# Patient Record
Sex: Female | Born: 1995 | Race: Black or African American | Hispanic: No | Marital: Single | Smoking: Never smoker
Health system: Southern US, Community
[De-identification: ages and names within clinical notes are randomized; demographics above are authoritative.]

## PROBLEM LIST (undated history)

## (undated) DIAGNOSIS — Z789 Other specified health status: Secondary | ICD-10-CM

## (undated) DIAGNOSIS — L309 Dermatitis, unspecified: Secondary | ICD-10-CM

## (undated) HISTORY — PX: WISDOM TOOTH EXTRACTION: SHX21

---

## 2016-08-07 LAB — HM PAP SMEAR: HM PAP: NEGATIVE

## 2017-08-12 ENCOUNTER — Encounter (HOSPITAL_COMMUNITY): Payer: Self-pay

## 2017-08-12 ENCOUNTER — Emergency Department (HOSPITAL_COMMUNITY)
Admission: EM | Admit: 2017-08-12 | Discharge: 2017-08-12 | Disposition: A | Discharge status: Home / Self Care | Payer: PRIVATE HEALTH INSURANCE | Attending: Emergency Medicine | Admitting: Emergency Medicine

## 2017-08-12 DIAGNOSIS — Y929 Unspecified place or not applicable: Secondary | ICD-10-CM | POA: Diagnosis not present

## 2017-08-12 DIAGNOSIS — S6991XA Unspecified injury of right wrist, hand and finger(s), initial encounter: Secondary | ICD-10-CM

## 2017-08-12 DIAGNOSIS — Y998 Other external cause status: Secondary | ICD-10-CM | POA: Insufficient documentation

## 2017-08-12 DIAGNOSIS — Y9389 Activity, other specified: Secondary | ICD-10-CM | POA: Insufficient documentation

## 2017-08-12 DIAGNOSIS — L309 Dermatitis, unspecified: Secondary | ICD-10-CM | POA: Insufficient documentation

## 2017-08-12 DIAGNOSIS — Y33XXXA Other specified events, undetermined intent, initial encounter: Secondary | ICD-10-CM | POA: Diagnosis not present

## 2017-08-12 MED ORDER — HYDROCODONE-ACETAMINOPHEN 5-325 MG PO TABS
1.0000 | ORAL_TABLET | Freq: Once | ORAL | Status: AC
  Administered 2017-08-12: 1 via ORAL
  Filled 2017-08-12: qty 1

## 2017-08-12 NOTE — Discharge Instructions (Signed)
Please read instructions below. Apply ice to your finger for 20 minutes at a time. You can take 600mg  of advil every 6 hours as needed for pain. Schedule an appointment with the hand specialist in week as needed if symptoms persist. Return to the ER for new or concerning symptoms.

## 2017-08-12 NOTE — ED Provider Notes (Signed)
[facility]-EMERGENCY DEPT Provider Note   CSN: [id number] Arrival date & time: 08/12/17  1732     History   Chief Complaint Chief Complaint  Patient presents with  . Finger Injury    HPI Andee LinemanMakeda Hildebrant is a 21 y.o. female without significant past medical history, presenting to the ED with acute onset of right fifth finger pain and edema that began 3 days ago. Patient states she was "pop in her knuckles" type pushing down on the middle interphalangeal joint, when she immediately had pain. She states since that time, her finger has been gradually becoming swollen, red, and painful. She reported to urgent care today, where they did an x-ray that was reportedly negative. She was recommended to come to the ED to rule out infection. States she is taking Advil with some relief of symptoms. Denies fever, chills, nausea, vomiting, history of immunocompromise, history of gout, or any other complaints today.  The history is provided by the patient.    History reviewed. No pertinent past medical history.  There are no active problems to display for this patient.   History reviewed. No pertinent surgical history.  OB History    No data available       Home Medications    Prior to Admission medications   Not on File    Family History No family history on file.  Social History Social History  Substance Use Topics  . Smoking status: Not on file  . Smokeless tobacco: Not on file  . Alcohol use Not on file     Allergies   Patient has no known allergies.   Review of Systems Review of Systems  Constitutional: Negative for chills and fever.  Gastrointestinal: Negative for nausea and vomiting.  Musculoskeletal: Positive for arthralgias and joint swelling.  Skin: Positive for color change. Negative for wound.  Neurological: Negative for numbness.     Physical Exam Updated Vital Signs BP 135/80 (BP Location: Left Arm)   Pulse 81   Temp 98.4 F (36.9 C) (Oral)   Resp 16   Wt  86.6 kg (191 lb)   LMP 07/29/2017   SpO2 99%   Physical Exam  Constitutional: She appears well-developed and well-nourished. No distress.  HENT:  Head: Normocephalic and atraumatic.  Eyes: Conjunctivae are normal.  Cardiovascular: Normal rate and intact distal pulses.   Intact radial and ulnar pulses.  Pulmonary/Chest: Effort normal.  Musculoskeletal:  Right fifth finger with edema, and erythema localized to the middle interphalangeal joint. Joint is TTP, with preserved active range of motion including extension and flexion of digit. Edema does not extend past the digit into the palm.  Neurological:  Digit and hand with normal sensation.  Psychiatric: She has a normal mood and affect. Her behavior is normal.  Nursing note and vitals reviewed.    ED Treatments / Results  Labs (all labs ordered are listed, but only abnormal results are displayed) Labs Reviewed - No data to display  EKG  EKG Interpretation None       Radiology No results found.  Procedures Procedures (including critical care time)  Medications Ordered in ED Medications  HYDROcodone-acetaminophen (NORCO/VICODIN) 5-325 MG per tablet 1 tablet (not administered)     Initial Impression / Assessment and Plan / ED Course  I have reviewed the triage vital signs and the nursing notes.  Pertinent labs & imaging results that were available during my care of the patient were reviewed by me and considered in my medical decision making (see chart  for details).     Patient presenting with right fifth finger pain and edema after attempting to "pop her knuckle." on exam, patient appears to have local inflammatory response to injury, with preserved active range of motion. NV intact. Patient without risk factors for infectious etiology. No wounds. No history of gout. Finger splint applied. Advised patient to follow up with PCP and monitor for signs of expanding erythema. Hand specialist referral given as needed if  symptoms persist. Conservative therapy recommended and discussed. She is afebrile, non-distress, safe for discharge home.  Patient discussed with Dr. Rosalia Hammers.  Discussed results, findings, treatment and follow up. Patient advised of return precautions. Patient verbalized understanding and agreed with plan.  Final Clinical Impressions(s) / ED Diagnoses   Final diagnoses:  Finger injury, right, initial encounter    New Prescriptions New Prescriptions   No medications on file     Russo, [place] N, PA-C 08/12/17 1836    Margarita Grizzle, MD 08/12/17 2115

## 2017-08-12 NOTE — ED Notes (Signed)
Patient Alert and oriented X4. Stable and ambulatory. Patient verbalized understanding of the discharge instructions.  Patient belongings were taken by the patient. Patient is not driving home

## 2017-08-12 NOTE — ED Triage Notes (Addendum)
Pt presents to the ed with complaints of swelling in her right pinky finger after trying to pop her knuckles two days ago. Sensation intact. Pt went to urgent care and had xrays and was sent here incase her pinky was infected.

## 2017-08-12 NOTE — ED Notes (Signed)
ED Provider at bedside. 

## 2018-05-27 ENCOUNTER — Ambulatory Visit (INDEPENDENT_AMBULATORY_CARE_PROVIDER_SITE_OTHER): Payer: PRIVATE HEALTH INSURANCE | Admitting: Adult Health

## 2018-05-27 ENCOUNTER — Encounter: Payer: Self-pay | Admitting: Adult Health

## 2018-05-27 ENCOUNTER — Other Ambulatory Visit: Payer: Self-pay | Admitting: Adult Health

## 2018-05-27 ENCOUNTER — Ambulatory Visit (HOSPITAL_BASED_OUTPATIENT_CLINIC_OR_DEPARTMENT_OTHER)
Admission: RE | Admit: 2018-05-27 | Discharge: 2018-05-27 | Disposition: A | Discharge status: Home / Self Care | Payer: PRIVATE HEALTH INSURANCE | Source: Ambulatory Visit | Attending: Adult Health | Admitting: Adult Health

## 2018-05-27 VITALS — BP 135/82 | HR 63 | Ht 65.0 in | Wt 204.0 lb

## 2018-05-27 DIAGNOSIS — Z0000 Encounter for general adult medical examination without abnormal findings: Secondary | ICD-10-CM | POA: Diagnosis not present

## 2018-05-27 DIAGNOSIS — R1909 Other intra-abdominal and pelvic swelling, mass and lump: Secondary | ICD-10-CM | POA: Diagnosis not present

## 2018-05-27 DIAGNOSIS — IMO0001 Reserved for inherently not codable concepts without codable children: Secondary | ICD-10-CM

## 2018-05-27 DIAGNOSIS — K429 Umbilical hernia without obstruction or gangrene: Secondary | ICD-10-CM | POA: Insufficient documentation

## 2018-05-27 DIAGNOSIS — Z789 Other specified health status: Secondary | ICD-10-CM

## 2018-05-27 MED ORDER — NORGESTIM-ETH ESTRAD TRIPHASIC 0.18/0.215/0.25 MG-35 MCG PO TABS: ORAL_TABLET | ORAL | 11 refills | Status: DC

## 2018-05-27 NOTE — Assessment & Plan Note (Deleted)
Pt has been NPO at day, instructed not to drink/eat until US completed/read Pt denies pain or change in bowel/bladder habits Urgent Abd US ordered

## 2018-05-27 NOTE — Assessment & Plan Note (Signed)
Urgent Abdominal Ultrasound ordered. We will call you when results are available and referral is appropriate. Recommend compete physical with fasting labs in 4 months.

## 2018-05-27 NOTE — Patient Instructions (Addendum)
Umbilical Hernia, Adult A hernia is a bulge of tissue that pushes through an opening between muscles. An umbilical hernia happens in the abdomen, near the belly button (umbilicus). The hernia may contain tissues from the small intestine, large intestine, or fatty tissue covering the intestines (omentum). Umbilical hernias in adults tend to get worse over time, and they require surgical treatment. There are several types of umbilical hernias. You may have:  A hernia located just above or below the umbilicus (indirect hernia). This is the most common type of umbilical hernia in adults.  A hernia that forms through an opening formed by the umbilicus (direct hernia).  A hernia that comes and goes (reducible hernia). A reducible hernia may be visible only when you strain, lift something heavy, or cough. This type of hernia can be pushed back into the abdomen (reduced).  A hernia that traps abdominal tissue inside the hernia (incarcerated hernia). This type of hernia cannot be reduced.  A hernia that cuts off blood flow to the tissues inside the hernia (strangulated hernia). The tissues can start to die if this happens. This type of hernia requires emergency treatment.  What are the causes? An umbilical hernia happens when tissue inside the abdomen presses on a weak area of the abdominal muscles. What increases the risk? You may have a greater risk of this condition if you:  Are obese.  Have had several pregnancies.  Have a buildup of fluid inside your abdomen (ascites).  Have had surgery that weakens the abdominal muscles.  What are the signs or symptoms? The main symptom of this condition is a painless bulge at or near the belly button. A reducible hernia may be visible only when you strain, lift something heavy, or cough. Other symptoms may include:  Dull pain.  A feeling of pressure.  Symptoms of a strangulated hernia may include:  Pain that gets increasingly worse.  Nausea and  vomiting.  Pain when pressing on the hernia.  Skin over the hernia becoming red or purple.  Constipation.  Blood in the stool.  How is this diagnosed? This condition may be diagnosed based on:  A physical exam. You may be asked to cough or strain while standing. These actions increase the pressure inside your abdomen and force the hernia through the opening in your muscles. Your health care provider may try to reduce the hernia by pressing on it.  Your symptoms and medical history.  How is this treated? Surgery is the only treatment for an umbilical hernia. Surgery for a strangulated hernia is done as soon as possible. If you have a small hernia that is not incarcerated, you may need to lose weight before having surgery. Follow these instructions at home:  Lose weight, if told by your health care provider.  Do not try to push the hernia back in.  Watch your hernia for any changes in color or size. Tell your health care provider if any changes occur.  You may need to avoid activities that increase pressure on your hernia.  Do not lift anything that is heavier than 10 lb (4.5 kg) until your health care provider says that this is safe.  Take over-the-counter and prescription medicines only as told by your health care provider.  Keep all follow-up visits as told by your health care provider. This is important. Contact a health care provider if:  Your hernia gets larger.  Your hernia becomes painful. Get help right away if:  You develop sudden, severe pain near the   area of your hernia.  You have pain as well as nausea or vomiting.  You have pain and the skin over your hernia changes color.  You develop a fever. This information is not intended to replace advice given to you by your health care provider. Make sure you discuss any questions you have with your health care provider. Document Released: 04/27/2016 Document Revised: 07/29/2016 Document Reviewed:  04/27/2016 Elsevier Interactive Patient Education  2018 [employer]   Mediterranean Diet A Mediterranean diet refers to food and lifestyle choices that are based on the traditions of countries located on the [place]. This way of eating has been shown to help prevent certain conditions and improve outcomes for people who have chronic diseases, like kidney disease and heart disease. What are tips for following this plan? Lifestyle  Cook and eat meals together with your family, when possible.  Drink enough fluid to keep your urine clear or pale yellow.  Be physically active every day. This includes: ? Aerobic exercise like running or swimming. ? Leisure activities like gardening, walking, or housework.  Get 7-8 hours of sleep each night.  If recommended by your health care provider, drink red wine in moderation. This means 1 glass a day for nonpregnant women and 2 glasses a day for men. A glass of wine equals 5 oz (150 mL). Reading food labels  Check the serving size of packaged foods. For foods such as rice and pasta, the serving size refers to the amount of cooked product, not dry.  Check the total fat in packaged foods. Avoid foods that have saturated fat or trans fats.  Check the ingredients list for added sugars, such as corn syrup. Shopping  At the grocery store, buy most of your food from the areas near the walls of the store. This includes: ? Fresh fruits and vegetables (produce). ? Grains, beans, nuts, and seeds. Some of these may be available in unpackaged forms or large amounts (in bulk). ? Fresh seafood. ? Poultry and eggs. ? Low-fat dairy products.  Buy whole ingredients instead of prepackaged foods.  Buy fresh fruits and vegetables in-season from local farmers markets.  Buy frozen fruits and vegetables in resealable bags.  If you do not have access to quality fresh seafood, buy precooked frozen shrimp or canned fish, such as tuna, salmon, or  sardines.  Buy small amounts of raw or cooked vegetables, salads, or olives from the deli or salad bar at your store.  Stock your pantry so you always have certain foods on hand, such as olive oil, canned tuna, canned tomatoes, rice, pasta, and beans. Cooking  Cook foods with extra-virgin olive oil instead of using butter or other vegetable oils.  Have meat as a side dish, and have vegetables or grains as your main dish. This means having meat in small portions or adding small amounts of meat to foods like pasta or stew.  Use beans or vegetables instead of meat in common dishes like chili or lasagna.  Experiment with different cooking methods. Try roasting or broiling vegetables instead of steaming or sauteing them.  Add frozen vegetables to soups, stews, pasta, or rice.  Add nuts or seeds for added healthy fat at each meal. You can add these to yogurt, salads, or vegetable dishes.  Marinate fish or vegetables using olive oil, lemon juice, garlic, and fresh herbs. Meal planning  Plan to eat 1 vegetarian meal one day each week. Try to work up to 2 vegetarian meals, if possible.  Eat seafood 2 or more times a week.  Have healthy snacks readily available, such as: ? Vegetable sticks with hummus. ? [place] yogurt. ? Fruit and nut trail mix.  Eat balanced meals throughout the week. This includes: ? Fruit: 2-3 servings a day ? Vegetables: 4-5 servings a day ? Low-fat dairy: 2 servings a day ? Fish, poultry, or lean meat: 1 serving a day ? Beans and legumes: 2 or more servings a week ? Nuts and seeds: 1-2 servings a day ? Whole grains: 6-8 servings a day ? Extra-virgin olive oil: 3-4 servings a day  Limit red meat and sweets to only a few servings a month What are my food choices?  Mediterranean diet ? Recommended ? Grains: Whole-grain pasta. Brown rice. Bulgar wheat. Polenta. Couscous. Whole-wheat bread. Orpah Cobb. ? Vegetables: Artichokes. Beets. Broccoli. Cabbage.  Carrots. Eggplant. Green beans. Chard. Kale. Spinach. Onions. Leeks. Peas. Squash. Tomatoes. Peppers. Radishes. ? Fruits: Apples. Apricots. Avocado. Berries. Bananas. Cherries. Dates. Figs. Grapes. Lemons. Melon. Oranges. Peaches. Plums. Pomegranate. ? Meats and other protein foods: Beans. Almonds. Sunflower seeds. Pine nuts. Peanuts. Cod. Salmon. Scallops. Shrimp. Tuna. Tilapia. Clams. Oysters. Eggs. ? Dairy: Low-fat milk. Cheese. Greek yogurt. ? Beverages: Water. Red wine. Herbal tea. ? Fats and oils: Extra virgin olive oil. Avocado oil. Grape seed oil. ? Sweets and desserts: [place] yogurt with honey. Baked apples. Poached pears. Trail mix. ? Seasoning and other foods: Basil. Cilantro. Coriander. Cumin. Mint. Parsley. Sage. Rosemary. Tarragon. Garlic. Oregano. Thyme. Pepper. Balsalmic vinegar. Tahini. Hummus. Tomato sauce. Olives. Mushrooms. ? Limit these ? Grains: Prepackaged pasta or rice dishes. Prepackaged cereal with added sugar. ? Vegetables: Deep fried potatoes (french fries). ? Fruits: Fruit canned in syrup. ? Meats and other protein foods: Beef. Pork. Lamb. Poultry with skin. Hot dogs. Tomasa Blase. ? Dairy: Ice cream. Sour cream. Whole milk. ? Beverages: Juice. Sugar-sweetened soft drinks. Beer. Liquor and spirits. ? Fats and oils: Butter. Canola oil. Vegetable oil. Beef fat (tallow). Lard. ? Sweets and desserts: Cookies. Cakes. Pies. Candy. ? Seasoning and other foods: Mayonnaise. Premade sauces and marinades. ? The items listed may not be a complete list. Talk with your dietitian about what dietary choices are right for you. Summary  The Mediterranean diet includes both food and lifestyle choices.  Eat a variety of fresh fruits and vegetables, beans, nuts, seeds, and whole grains.  Limit the amount of red meat and sweets that you eat.  Talk with your health care provider about whether it is safe for you to drink red wine in moderation. This means 1 glass a day for nonpregnant women  and 2 glasses a day for men. A glass of wine equals 5 oz (150 mL). This information is not intended to replace advice given to you by your health care provider. Make sure you discuss any questions you have with your health care provider. Document Released: 07/19/2016 Document Revised: 08/21/2016 Document Reviewed: 07/19/2016 Elsevier Interactive Patient Education  2018 [employer].   Urgent Abdominal Ultrasound ordered. We will call you when results are available and referral is appropriate. Recommend compete physical with fasting labs in 4 months. WELCOME TO THE PRACTICE!

## 2018-05-27 NOTE — Progress Notes (Addendum)
Subjective:    Patient ID: Faith Bush, female    DOB: [birth year], 22 y.o.   MRN: [id number]  HPI:  Faith Bush is here to establish as a new pt.  She is a pleasant 22 year old female.   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 She denies lifting heavy objects or acute trauma prior to increase in sx's. Sx's include- intermittent pain with palpation and "a spot that turned black" a few weeks ago. She denies fever/poor appetite/change in bowel or bladder habits She denies tobacco/ETOH use She denies formal exercise She works PT at [employer] as Conservation officer, nature and will start graduate school at [employer] next month  Patient Care Team    Relationship Specialty Notifications Start End  Faith Bush, Jinny Blossom, NP PCP - General Family Medicine  05/06/18     Patient Active Problem List   Diagnosis Date Noted  . Healthcare maintenance 05/27/2018  . Recurrent umbilical hernia 05/27/2018  . Eczema 08/12/2017     No past medical history on file.   No past surgical history on file.   No family history on file.   Social History   Substance and Sexual Activity  Drug Use Never     Social History   Substance and Sexual Activity  Alcohol Use Yes   Comment: 3 drinks/week     Social History   Tobacco Use  Smoking Status Never Smoker  Smokeless Tobacco Never Used     Outpatient Encounter Medications as of 05/27/2018  Medication Sig  . Norgestimate-Ethinyl Estradiol Triphasic (ORTHO TRI-CYCLEN, 28,) 0.18/0.215/0.25 MG-35 MCG tablet Ortho Tri-Cyclen (28) 0.18 mg(7)/0.215 mg(7)/0.25 mg(7)-35 mcg tablet  Take 1 tablet every day by oral route.  . [DISCONTINUED] Norgestimate-Ethinyl Estradiol Triphasic (ORTHO TRI-CYCLEN, 28,) 0.18/0.215/0.25 MG-35 MCG tablet Ortho Tri-Cyclen (28) 0.18 mg(7)/0.215 mg(7)/0.25 mg(7)-35 mcg tablet  Take 1 tablet every day by oral route.   No facility-administered encounter medications on file as of  05/27/2018.     Allergies: Patient has no known allergies.  Body mass index is 33.95 kg/m.  Blood pressure 135/82, pulse 63, height 5\' 5"  (1.651 m), weight 204 lb (92.5 kg), last menstrual period 05/15/2018, SpO2 100 %.  Review of Systems  Constitutional: Negative for activity change, appetite change, chills, diaphoresis, fatigue, fever and unexpected weight change.  Eyes: Negative for visual disturbance.  Respiratory: Negative for cough, chest tightness, shortness of breath, wheezing and stridor.   Cardiovascular: Negative for chest pain, palpitations and leg swelling.  Gastrointestinal: Positive for abdominal pain and constipation. Negative for abdominal distention, blood in stool, diarrhea, nausea and vomiting.  Endocrine: Negative for cold intolerance, heat intolerance, polydipsia, polyphagia and polyuria.  Genitourinary: Negative for difficulty urinating, flank pain and hematuria.  Musculoskeletal: Negative for arthralgias, back pain, gait problem, joint swelling, myalgias, neck pain and neck stiffness.  Skin: Negative for color change, pallor, rash and wound.  Neurological: Negative for dizziness and headaches.  Hematological: Does not bruise/bleed easily.  Psychiatric/Behavioral: Negative for hallucinations, self-injury, sleep disturbance and suicidal ideas. The patient is not nervous/anxious and is not hyperactive.        Objective:   Physical Exam  Constitutional: She appears well-developed and well-nourished. No distress.  HENT:  Head: Normocephalic and atraumatic.  Right Ear: External ear normal.  Left Ear: External ear normal.  Cardiovascular: Normal rate, regular rhythm, normal heart sounds and intact distal pulses.  No murmur heard. Pulmonary/Chest: Effort normal and breath sounds normal. No stridor.  No respiratory distress. She has no wheezes. She has no rales. She exhibits no tenderness.  Abdominal: Soft. Bowel sounds are normal. She exhibits no distension and no  mass. There is tenderness in the periumbilical area. There is no rebound, no guarding, no tenderness at McBurney's point and negative Murphy's sign. No hernia.  Blackened tissue at 3 o clock position around umbilical  Palpable mass at top R of umbilical region No hernia appreciated  Skin: She is not diaphoretic.      Assessment & Plan:   1. Healthcare maintenance   2. Umbilical mass   3. Birth control     Healthcare maintenance Urgent Abdominal Ultrasound ordered. We will call you when results are available and referral is appropriate. Recommend compete physical with fasting labs in 4 months.  Recurrent umbilical hernia US 05/28/18 Results- IMPRESSION: No hernia is identified. Hypoechoic focus in the subcutaneous tissues as described above is nonspecific. It could represent a sebaceous cyst, fat necrosis or less likely hematoma. It does not appear aggressive.  Referral to general surgery placed to evaluate/treat   FOLLOW-UP:  Return in about 4 months (around 09/26/2018) for CPE, Fasting Labs.

## 2018-05-28 ENCOUNTER — Telehealth: Payer: Self-pay | Admitting: Adult Health

## 2018-05-28 ENCOUNTER — Other Ambulatory Visit: Payer: Self-pay | Admitting: Adult Health

## 2018-05-28 DIAGNOSIS — L723 Sebaceous cyst: Secondary | ICD-10-CM

## 2018-05-28 NOTE — Assessment & Plan Note (Signed)
US 05/28/18 Results- IMPRESSION: No hernia is identified. Hypoechoic focus in the subcutaneous tissues as described above is nonspecific. It could represent a sebaceous cyst, fat necrosis or less likely hematoma. It does not appear aggressive.  Referral to general surgery placed to evaluate/treat

## 2018-05-28 NOTE — Telephone Encounter (Signed)
Patient states Faith Bush called her to give results of Ultrasound---   ---patient reachable at [phone].  --forwarding message.  --glh

## 2018-06-09 ENCOUNTER — Ambulatory Visit: Payer: Self-pay | Admitting: General Surgery

## 2018-06-09 NOTE — H&P (Signed)
History of Present Illness Axel Filler(Cedric Denison MD; 06/09/2018 2:58 PM) The patient is a 22 year old female who presents with a complaint of cyst. Patient is a 22 year old female who is referred by Ayesha MohairKatie Danford, [facility] for chief complaint of a umbilical sebaceous cyst. Patient states that she's had these there for several months. He states that over the last couple months she's noticed that his changed colors and become more bothersome. She states sore nature. She states that she's had no drainage. She states it is not changed with her monthly cycles. Patient underwent ultrasound which did reveal subcutaneous sebaceous cyst versus fat necrosis versus less likely hematoma. There is no hernia seen. Incomplete this personally.     Past Surgical History (Tanisha A. Manson PasseyBrown, RMA; 06/09/2018 2:49 PM) Oral Surgery   Diagnostic Studies History (Tanisha A. Manson PasseyBrown, RMA; 06/09/2018 2:49 PM) Colonoscopy  never Mammogram  never Pap Smear  never  Allergies (Tanisha A. Manson PasseyBrown, RMA; 06/09/2018 2:50 PM) No Known Drug Allergies [06/09/2018]: Allergies Reconciled   Medication History (Tanisha A. Manson PasseyBrown, RMA; 06/09/2018 2:50 PM) Tri-Sprintec (0.18/0.215/0.25MG -35 MCG Tablet, Oral) Active. Medications Reconciled  Social History (Tanisha A. Manson PasseyBrown, RMA; 06/09/2018 2:49 PM) Alcohol use  Occasional alcohol use. No caffeine use  No drug use  Tobacco use  Never smoker.  Family History (Tanisha A. Manson PasseyBrown, RMA; 06/09/2018 2:49 PM) Arthritis  Family Members In General. Diabetes Mellitus  Family Members In General. Heart disease in female family member before age 555  Hypertension  Family Members In General.  Pregnancy / Birth History (Tanisha A. Manson PasseyBrown, RMA; 06/09/2018 2:49 PM) Age at menarche  12 years. Contraceptive History  Oral contraceptives. Gravida  0 Para  0 Regular periods   Other Problems (Tanisha A. Manson PasseyBrown, RMA; 06/09/2018 2:49 PM) Umbilical Hernia Repair     Review of Systems Axel Filler(Latron Ribas MD; 06/09/2018 2:57 PM) General Not Present- Appetite Loss, Chills, Fatigue, Fever, Night Sweats, Weight Gain and Weight Loss. Skin Present- Dryness. Not Present- Change in Wart/Mole, Hives, Jaundice, New Lesions, Non-Healing Wounds, Rash and Ulcer. HEENT Present- Seasonal Allergies. Not Present- Earache, Hearing Loss, Hoarseness, Nose Bleed, Oral Ulcers, Ringing in the Ears, Sinus Pain, Sore Throat, Visual Disturbances, Wears glasses/contact lenses and Yellow Eyes. Respiratory Not Present- Bloody sputum, Chronic Cough, Difficulty Breathing, Snoring and Wheezing. Breast Not Present- Breast Mass, Breast Pain, Nipple Discharge and Skin Changes. Cardiovascular Not Present- Chest Pain, Difficulty Breathing Lying Down, Leg Cramps, Palpitations, Rapid Heart Rate, Shortness of Breath and Swelling of Extremities. Gastrointestinal Present- Change in Bowel Habits. Not Present- Abdominal Pain, Bloating, Bloody Stool, Chronic diarrhea, Constipation, Difficulty Swallowing, Excessive gas, Gets full quickly at meals, Hemorrhoids, Indigestion, Nausea, Rectal Pain and Vomiting. Female Genitourinary Not Present- Frequency, Nocturia, Painful Urination, Pelvic Pain and Urgency. Musculoskeletal Not Present- Back Pain, Joint Pain, Joint Stiffness, Muscle Pain, Muscle Weakness and Swelling of Extremities. Neurological Not Present- Decreased Memory, Fainting, Headaches, Numbness, Seizures, Tingling, Tremor, Trouble walking and Weakness. Psychiatric Not Present- Anxiety, Bipolar, Change in Sleep Pattern, Depression, Fearful and Frequent crying. Endocrine Not Present- Cold Intolerance, Excessive Hunger, Hair Changes, Heat Intolerance, Hot flashes and New Diabetes. Hematology Not Present- Blood Thinners, Easy Bruising, Excessive bleeding, Gland problems, HIV and Persistent Infections. All other systems negative  Vitals (Tanisha A. Brown RMA; 06/09/2018 2:50 PM) 06/09/2018 2:49 PM Weight: 203.4 lb Height: 65in Body  Surface Area: 1.99 m Body Mass Index: 33.85 kg/m  Temp.: 98.72F  Pulse: 88 (Regular)  BP: 124/86 (Sitting, Left Arm, Standard)       Physical  Exam Axel Filler MD; 06/09/2018 2:58 PM) The physical exam findings are as follows: Note:Constitutional: No acute distress, conversant, appears stated age  Eyes: Anicteric sclerae, moist conjunctiva, no lid lag  Neck: No thyromegaly, trachea midline, no cervical lymphadenopathy  Lungs: Clear to auscultation biilaterally, normal respiratory effot  Cardiovascular: regular rate & rhythm, no murmurs, no peripheal edema, pedal pulses 2+  GI: Soft, no masses or hepatosplenomegaly, non-tender to palpation, left lateral cyst, approximately 2 x 1 cm,  MSK: Normal gait, no clubbing cyanosis, edema  Skin: No rashes, palpation reveals normal skin turgor  Psychiatric: Appropriate judgment and insight, oriented to person, place, and time    Assessment & Plan Axel Filler MD; 06/09/2018 2:59 PM) CYST OF SKIN (L72.9) Impression: 22 year old female with a left lateral umbilical cyst.  1. Will proceed to the OR For excision of cyst. 2. Discussed with her the risks and benefits of the procedure to include but not limited to: Infection, bleeding, demonstrating treasure, possible recurrence. Patient was understanding and wishes to proceed.

## 2018-07-18 NOTE — Pre-Procedure Instructions (Signed)
Faith Bush  07/18/2018      [facility] #[id number] [place], [place] - [address] AT [facility] OF [facility] & [address] [place] [address] Phone: [phone] Fax: [phone]    Your procedure is scheduled on 07/28/18.  Report to [facility] Admitting at 530 A.M.  Call this number if you have problems the morning of surgery:  [phone]   Remember:  Do not eat or drink after midnight.     Take these medicines the morning of surgery with A SIP OF WATER --bcp    Do not wear jewelry, make-up or nail polish.  Do not wear lotions, powders, or perfumes, or deodorant.  Do not shave 48 hours prior to surgery.  Men may shave face and neck.  Do not bring valuables to the hospital.  [facility] is not responsible for any belongings or valuables.  Contacts, dentures or bridgework may not be worn into surgery.  Leave your suitcase in the car.  After surgery it may be brought to your room.  For patients admitted to the hospital, discharge time will be determined by your treatment team.  Patients discharged the day of surgery will not be allowed to drive home.   Name and phone number of your driver:   ---Do not take any aspirin,anti-inflammatories,vitamins,or herbal supplements 5-7 days prior to surgery. Special instructions:  [facility] - Preparing for Surgery  Before surgery, you can play an important role.  Because skin is not sterile, your skin needs to be as free of germs as possible.  You can reduce the number of germs on you skin by washing with CHG (chlorahexidine gluconate) soap before surgery.  CHG is an antiseptic cleaner which kills germs and bonds with the skin to continue killing germs even after washing.  Oral Hygiene is also important in reducing the risk of infection.  Remember to brush your teeth with your regular toothpaste the morning of surgery.  Please DO NOT use if you have an allergy to CHG or antibacterial  soaps.  If your skin becomes reddened/irritated stop using the CHG and inform your nurse when you arrive at Short Stay.  Do not shave (including legs and underarms) for at least 48 hours prior to the first CHG shower.  You may shave your face.  Please follow these instructions carefully:   1.  Shower with CHG Soap the night before surgery and the morning of Surgery.  2.  If you choose to wash your hair, wash your hair first as usual with your normal shampoo.  3.  After you shampoo, rinse your hair and body thoroughly to remove the shampoo. 4.  Use CHG as you would any other liquid soap.  You can apply chg directly to the skin and wash gently with a      scrungie or washcloth.           5.  Apply the CHG Soap to your body ONLY FROM THE NECK DOWN.   Do not use on open wounds or open sores. Avoid contact with your eyes, ears, mouth and genitals (private parts).  Wash genitals (private parts) with your normal soap.  6.  Wash thoroughly, paying special attention to the area where your surgery will be performed.  7.  Thoroughly rinse your body with warm water from the neck down.  8.  DO NOT shower/wash with your normal soap after using and rinsing off the  CHG Soap.  9.  Pat yourself dry with a clean towel.            10.  Wear clean pajamas.            11.  Place clean sheets on your bed the night of your first shower and do not sleep with pets.  Day of Surgery  Do not apply any lotions/deoderants the morning of surgery.   Please wear clean clothes to the hospital/surgery center. Remember to brush your teeth with toothpaste.  [facility] - Preparing for Surgery  Before surgery, you can play an important role.  Because skin is not sterile, your skin needs to be as free of germs as possible.  You can reduce the number of germs on you skin by washing with CHG (chlorahexidine gluconate) soap before surgery.  CHG is an antiseptic cleaner which kills germs and bonds with the skin to continue killing  germs even after washing.  Oral Hygiene is also important in reducing the risk of infection.  Remember to brush your teeth with your regular toothpaste the morning of surgery.  Please DO NOT use if you have an allergy to CHG or antibacterial soaps.  If your skin becomes reddened/irritated stop using the CHG and inform your nurse when you arrive at Short Stay.  Do not shave (including legs and underarms) for at least 48 hours prior to the first CHG shower.  You may shave your face.  Please follow these instructions carefully:   1.  Shower with CHG Soap the night before surgery and the morning of Surgery.  2.  If you choose to wash your hair, wash your hair first as usual with your normal shampoo.  3.  After you shampoo, rinse your hair and body thoroughly to remove the shampoo. 4.  Use CHG as you would any other liquid soap.  You can apply chg directly to the skin and wash gently with a      scrungie or washcloth.           5.  Apply the CHG Soap to your body ONLY FROM THE NECK DOWN.   Do not use on open wounds or open sores. Avoid contact with your eyes, ears, mouth and genitals (private parts).  Wash genitals (private parts) with your normal soap.  6.  Wash thoroughly, paying special attention to the area where your surgery will be performed.  7.  Thoroughly rinse your body with warm water from the neck down.  8.  DO NOT shower/wash with your normal soap after using and rinsing off the CHG Soap.  9.  Pat yourself dry with a clean towel.            10.  Wear clean pajamas.            11.  Place clean sheets on your bed the night of your first shower and do not sleep with pets.  Day of Surgery  Do not apply any lotions/deoderants the morning of surgery.   Please wear clean clothes to the hospital/surgery center. Remember to brush your teeth with toothpaste.  *  Please read over the following fact sheets that you were given.

## 2018-07-21 ENCOUNTER — Encounter (HOSPITAL_COMMUNITY): Payer: Self-pay

## 2018-07-21 ENCOUNTER — Encounter (HOSPITAL_COMMUNITY)
Admission: RE | Admit: 2018-07-21 | Discharge: 2018-07-21 | Disposition: A | Discharge status: Home / Self Care | Payer: PRIVATE HEALTH INSURANCE | Source: Ambulatory Visit | Attending: General Surgery | Admitting: General Surgery

## 2018-07-21 DIAGNOSIS — Z01812 Encounter for preprocedural laboratory examination: Secondary | ICD-10-CM | POA: Insufficient documentation

## 2018-07-21 HISTORY — DX: Dermatitis, unspecified: L30.9

## 2018-07-21 HISTORY — DX: Other specified health status: Z78.9

## 2018-07-21 LAB — CBC
HEMATOCRIT: 37.1 % (ref 36.0–46.0)
HEMOGLOBIN: 11.9 g/dL — AB (ref 12.0–15.0)
MCH: 26.6 pg (ref 26.0–34.0)
MCHC: 32.1 g/dL (ref 30.0–36.0)
MCV: 83 fL (ref 78.0–100.0)
Platelets: 199 10*3/uL (ref 150–400)
RBC: 4.47 MIL/uL (ref 3.87–5.11)
RDW: 13.6 % (ref 11.5–15.5)
WBC: 5.9 10*3/uL (ref 4.0–10.5)

## 2018-07-21 MED ORDER — CHLORHEXIDINE GLUCONATE CLOTH 2 % EX PADS: 6.0000 | MEDICATED_PAD | Freq: Once | CUTANEOUS | Status: DC

## 2018-07-28 ENCOUNTER — Other Ambulatory Visit: Payer: Self-pay

## 2018-07-28 ENCOUNTER — Encounter (HOSPITAL_COMMUNITY): Payer: Self-pay | Admitting: *Deleted

## 2018-07-28 ENCOUNTER — Ambulatory Visit (HOSPITAL_COMMUNITY): Payer: No Typology Code available for payment source

## 2018-07-28 ENCOUNTER — Ambulatory Visit (HOSPITAL_COMMUNITY)
Admission: RE | Admit: 2018-07-28 | Discharge: 2018-07-28 | Disposition: A | Discharge status: Home / Self Care | Payer: No Typology Code available for payment source | Source: Ambulatory Visit | Attending: General Surgery | Admitting: General Surgery

## 2018-07-28 ENCOUNTER — Encounter (HOSPITAL_COMMUNITY)
Admission: RE | Disposition: A | Discharge status: Home / Self Care | Payer: Self-pay | Source: Ambulatory Visit | Attending: General Surgery

## 2018-07-28 DIAGNOSIS — R1905 Periumbilic swelling, mass or lump: Secondary | ICD-10-CM | POA: Insufficient documentation

## 2018-07-28 HISTORY — PX: EXCISION OF KELOID: SHX6267

## 2018-07-28 LAB — POCT PREGNANCY, URINE: Preg Test, Ur: NEGATIVE

## 2018-07-28 SURGERY — EXCISION, KELOID
Anesthesia: General | Site: Abdomen

## 2018-07-28 MED ORDER — LACTATED RINGERS IV SOLN
INTRAVENOUS | Status: DC | PRN
  Administered 2018-07-28 (×2): via INTRAVENOUS

## 2018-07-28 MED ORDER — ONDANSETRON HCL 4 MG/2ML IJ SOLN
INTRAMUSCULAR | Status: AC
  Filled 2018-07-28: qty 2

## 2018-07-28 MED ORDER — ROCURONIUM BROMIDE 50 MG/5ML IV SOSY
PREFILLED_SYRINGE | INTRAVENOUS | Status: DC | PRN
  Administered 2018-07-28: 50 mg via INTRAVENOUS

## 2018-07-28 MED ORDER — ACETAMINOPHEN 500 MG PO TABS
1000.0000 mg | ORAL_TABLET | ORAL | Status: AC
  Administered 2018-07-28: 1000 mg via ORAL
  Filled 2018-07-28: qty 2

## 2018-07-28 MED ORDER — TRAMADOL HCL 50 MG PO TABS: 50.0000 mg | ORAL_TABLET | Freq: Four times a day (QID) | ORAL | 0 refills | Status: DC | PRN

## 2018-07-28 MED ORDER — SUGAMMADEX SODIUM 200 MG/2ML IV SOLN
INTRAVENOUS | Status: DC | PRN
  Administered 2018-07-28: 200 mg via INTRAVENOUS

## 2018-07-28 MED ORDER — DEXAMETHASONE SODIUM PHOSPHATE 10 MG/ML IJ SOLN
INTRAMUSCULAR | Status: AC
  Filled 2018-07-28: qty 1

## 2018-07-28 MED ORDER — PROPOFOL 10 MG/ML IV BOLUS
INTRAVENOUS | Status: DC | PRN
  Administered 2018-07-28: 130 mg via INTRAVENOUS

## 2018-07-28 MED ORDER — 0.9 % SODIUM CHLORIDE (POUR BTL) OPTIME
TOPICAL | Status: DC | PRN
  Administered 2018-07-28: 1000 mL

## 2018-07-28 MED ORDER — DEXAMETHASONE SODIUM PHOSPHATE 10 MG/ML IJ SOLN
INTRAMUSCULAR | Status: DC | PRN
  Administered 2018-07-28: 10 mg via INTRAVENOUS

## 2018-07-28 MED ORDER — BUPIVACAINE-EPINEPHRINE (PF) 0.25% -1:200000 IJ SOLN
INTRAMUSCULAR | Status: AC
  Filled 2018-07-28: qty 30

## 2018-07-28 MED ORDER — MIDAZOLAM HCL 5 MG/5ML IJ SOLN
INTRAMUSCULAR | Status: DC | PRN
  Administered 2018-07-28: 2 mg via INTRAVENOUS

## 2018-07-28 MED ORDER — ROCURONIUM BROMIDE 50 MG/5ML IV SOSY
PREFILLED_SYRINGE | INTRAVENOUS | Status: AC
  Filled 2018-07-28: qty 5

## 2018-07-28 MED ORDER — GABAPENTIN 300 MG PO CAPS
300.0000 mg | ORAL_CAPSULE | ORAL | Status: AC
  Administered 2018-07-28: 300 mg via ORAL
  Filled 2018-07-28: qty 1

## 2018-07-28 MED ORDER — CELECOXIB 200 MG PO CAPS
200.0000 mg | ORAL_CAPSULE | ORAL | Status: AC
  Administered 2018-07-28: 200 mg via ORAL
  Filled 2018-07-28: qty 1

## 2018-07-28 MED ORDER — LIDOCAINE 2% (20 MG/ML) 5 ML SYRINGE
INTRAMUSCULAR | Status: AC
  Filled 2018-07-28: qty 5

## 2018-07-28 MED ORDER — BUPIVACAINE-EPINEPHRINE 0.25% -1:200000 IJ SOLN
INTRAMUSCULAR | Status: DC | PRN
  Administered 2018-07-28: 10 mL

## 2018-07-28 MED ORDER — PROPOFOL 10 MG/ML IV BOLUS
INTRAVENOUS | Status: AC
  Filled 2018-07-28: qty 20

## 2018-07-28 MED ORDER — STERILE WATER FOR IRRIGATION IR SOLN
Status: DC | PRN
  Administered 2018-07-28: 1000 mL

## 2018-07-28 MED ORDER — FENTANYL CITRATE (PF) 250 MCG/5ML IJ SOLN
INTRAMUSCULAR | Status: AC
  Filled 2018-07-28: qty 5

## 2018-07-28 MED ORDER — ONDANSETRON HCL 4 MG/2ML IJ SOLN
INTRAMUSCULAR | Status: DC | PRN
  Administered 2018-07-28: 4 mg via INTRAVENOUS

## 2018-07-28 MED ORDER — FENTANYL CITRATE (PF) 100 MCG/2ML IJ SOLN
INTRAMUSCULAR | Status: DC | PRN
  Administered 2018-07-28: 150 ug via INTRAVENOUS

## 2018-07-28 MED ORDER — CEFAZOLIN SODIUM-DEXTROSE 2-4 GM/100ML-% IV SOLN
2.0000 g | INTRAVENOUS | Status: AC
  Administered 2018-07-28: 2 g via INTRAVENOUS
  Filled 2018-07-28: qty 100

## 2018-07-28 MED ORDER — LIDOCAINE 2% (20 MG/ML) 5 ML SYRINGE
INTRAMUSCULAR | Status: DC | PRN
  Administered 2018-07-28: 40 mg via INTRAVENOUS

## 2018-07-28 MED ORDER — MIDAZOLAM HCL 2 MG/2ML IJ SOLN
INTRAMUSCULAR | Status: AC
  Filled 2018-07-28: qty 2

## 2018-07-28 SURGICAL SUPPLY — 31 items
CHLORAPREP W/TINT 26ML (MISCELLANEOUS) ×3 IMPLANT
COVER SURGICAL LIGHT HANDLE (MISCELLANEOUS) ×3 IMPLANT
DERMABOND ADVANCED (GAUZE/BANDAGES/DRESSINGS) ×2
DERMABOND ADVANCED .7 DNX12 (GAUZE/BANDAGES/DRESSINGS) ×1 IMPLANT
DRAPE LAPAROTOMY 100X72 PEDS (DRAPES) ×3 IMPLANT
ELECT REM PT RETURN 9FT ADLT (ELECTROSURGICAL) ×3
ELECTRODE REM PT RTRN 9FT ADLT (ELECTROSURGICAL) ×1 IMPLANT
GLOVE BIO SURGEON STRL SZ7.5 (GLOVE) ×3 IMPLANT
GLOVE BIOGEL PI IND STRL 8 (GLOVE) ×1 IMPLANT
GLOVE BIOGEL PI INDICATOR 8 (GLOVE) ×2
GOWN STRL REUS W/ TWL LRG LVL3 (GOWN DISPOSABLE) ×1 IMPLANT
GOWN STRL REUS W/ TWL XL LVL3 (GOWN DISPOSABLE) ×1 IMPLANT
GOWN STRL REUS W/TWL LRG LVL3 (GOWN DISPOSABLE) ×2
GOWN STRL REUS W/TWL XL LVL3 (GOWN DISPOSABLE) ×2
KIT BASIN OR (CUSTOM PROCEDURE TRAY) ×3 IMPLANT
KIT TURNOVER KIT B (KITS) ×3 IMPLANT
NEEDLE HYPO 25GX1X1/2 BEV (NEEDLE) ×3 IMPLANT
NS IRRIG 1000ML POUR BTL (IV SOLUTION) ×3 IMPLANT
PACK SURGICAL SETUP 50X90 (CUSTOM PROCEDURE TRAY) ×3 IMPLANT
PAD ARMBOARD 7.5X6 YLW CONV (MISCELLANEOUS) ×3 IMPLANT
SPECIMEN JAR SMALL (MISCELLANEOUS) ×3 IMPLANT
SPONGE LAP 18X18 X RAY DECT (DISPOSABLE) ×3 IMPLANT
SUT ETHIBOND 0 MO6 C/R (SUTURE) ×3 IMPLANT
SUT MNCRL AB 4-0 PS2 18 (SUTURE) ×3 IMPLANT
SUT VIC AB 3-0 SH 18 (SUTURE) ×3 IMPLANT
SYR BULB 3OZ (MISCELLANEOUS) ×3 IMPLANT
SYR CONTROL 10ML LL (SYRINGE) ×3 IMPLANT
TOWEL GREEN STERILE FF (TOWEL DISPOSABLE) ×3 IMPLANT
TUBE CONNECTING 12'X1/4 (SUCTIONS) ×1
TUBE CONNECTING 12X1/4 (SUCTIONS) ×2 IMPLANT
YANKAUER SUCT BULB TIP NO VENT (SUCTIONS) ×3 IMPLANT

## 2018-07-28 NOTE — Transfer of Care (Signed)
Immediate Anesthesia Transfer of Care Note  Patient: Faith Bush  Procedure(s) Performed: EXCISION OF UMBILICAL MASS / CYST (N/A Abdomen)  Patient Location: PACU  Anesthesia Type:General  Level of Consciousness: awake, alert  and oriented  Airway & Oxygen Therapy: Patient Spontanous Breathing  Post-op Assessment: Report given to RN and Post -op Vital signs reviewed and stable  Post vital signs: Reviewed and stable  Last Vitals:  Vitals Value Taken Time  BP    Temp    Pulse 118 07/28/2018  8:10 AM  Resp 21 07/28/2018  8:10 AM  SpO2 97 % 07/28/2018  8:10 AM  Vitals shown include unvalidated device data.  Last Pain:  Vitals:   07/28/18 0604  TempSrc:   PainSc: 0-No pain         Complications: No apparent anesthesia complications

## 2018-07-28 NOTE — Op Note (Signed)
07/28/2018  8:02 AM  PATIENT:  Faith Bush  22 y.o. female  PRE-OPERATIVE DIAGNOSIS:  UMBILICAL CYST  POST-OPERATIVE DIAGNOSIS:  UMBILICAL CYST  PROCEDURE:  Procedure(s): EXCISION OF UMBILICAL MASS / CYST (N/A)  SURGEON:  Surgeon(s) and Role:    * Axel Filleramirez, Zharia Conrow, MD - Primary  ANESTHESIA:   local and general  EBL:  minimal   BLOOD ADMINISTERED:none  DRAINS: none   LOCAL MEDICATIONS USED:  BUPIVICAINE   SPECIMEN:  Source of Specimen:  Umbilical cyst  DISPOSITION OF SPECIMEN:  PATHOLOGY  COUNTS:  YES  TOURNIQUET:  * No tourniquets in log *  DICTATION: .Dragon Dictation Details of procedure: After the patient was consented she was taken back to the OR and placed in the supine position with bilateral SCDs in place.  She underwent general endotracheal intubation.  Patient was then prepped and draped in standard fashion.  A timeout was called and all facts were verified.  A curvilinear incision was made over the area of the cyst at the umbilicus.  Cautery was used to maintain hemostasis and dissection was taken down to the base.  This did go down to the fascial area.  There appeared to be a small hernia at the base.  There was some leakage of the cyst contents.  This was chocolate brown.  This was excised in its entirety.  This was sent off to pathology.  At this time 0 Ethibond were then used to reapproximate the fascial defect x3.  The skin was then reapproximated using 3-0 Vicryls in interrupted fashion.  The skin was reapproximated using 4-0 Monocryl subcuticular fashion.  The area was dressed with Dermabond.  The patient tolerated the procedure well was taken to the recovery in stable condition.  PLAN OF CARE: Discharge to home after PACU  PATIENT DISPOSITION:  PACU - hemodynamically stable.   Delay start of Pharmacological VTE agent (>24hrs) due to surgical blood loss or risk of bleeding: not applicable

## 2018-07-28 NOTE — Anesthesia Preprocedure Evaluation (Signed)
Anesthesia Evaluation  Patient identified by MRN, date of birth, ID band Patient awake    Reviewed: Allergy & Precautions, NPO status , Patient's Chart, lab work & pertinent test results  Airway Mallampati: I  TM Distance: >3 FB Neck ROM: Full    Dental   Pulmonary    Pulmonary exam normal        Cardiovascular Normal cardiovascular exam     Neuro/Psych    GI/Hepatic   Endo/Other    Renal/GU      Musculoskeletal   Abdominal   Peds  Hematology   Anesthesia Other Findings   Reproductive/Obstetrics                             Anesthesia Physical Anesthesia Plan  ASA: II  Anesthesia Plan: General   Post-op Pain Management:    Induction: Intravenous  PONV Risk Score and Plan: 3 and Ondansetron and Treatment may vary due to age or medical condition  Airway Management Planned: Oral ETT  Additional Equipment:   Intra-op Plan:   Post-operative Plan: Extubation in OR  Informed Consent: I have reviewed the patients History and Physical, chart, labs and discussed the procedure including the risks, benefits and alternatives for the proposed anesthesia with the patient or authorized representative who has indicated his/her understanding and acceptance.     Plan Discussed with: CRNA and Surgeon  Anesthesia Plan Comments:         Anesthesia Quick Evaluation

## 2018-07-28 NOTE — Anesthesia Procedure Notes (Signed)
Procedure Name: Intubation Date/Time: 07/28/2018 7:34 AM Performed by: Sheppard EvensManess, Azula Zappia B, CRNA Pre-anesthesia Checklist: Patient identified, Emergency Drugs available, Suction available and Patient being monitored Patient Re-evaluated:Patient Re-evaluated prior to induction Oxygen Delivery Method: Circle System Utilized Preoxygenation: Pre-oxygenation with 100% oxygen Induction Type: IV induction Ventilation: Mask ventilation without difficulty Laryngoscope Size: 2 Grade View: Grade I Tube type: Oral Tube size: 7.0 mm Number of attempts: 1 Airway Equipment and Method: Stylet and Oral airway Placement Confirmation: ETT inserted through vocal cords under direct vision,  positive ETCO2 and breath sounds checked- equal and bilateral Secured at: 21 cm Tube secured with: Tape Dental Injury: Teeth and Oropharynx as per pre-operative assessment

## 2018-07-28 NOTE — Anesthesia Postprocedure Evaluation (Signed)
Anesthesia Post Note  Patient: Faith Bush  Procedure(s) Performed: EXCISION OF UMBILICAL MASS / CYST (N/A Abdomen)     Patient location during evaluation: PACU Anesthesia Type: General Level of consciousness: awake and alert Pain management: pain level controlled Vital Signs Assessment: post-procedure vital signs reviewed and stable Respiratory status: spontaneous breathing, nonlabored ventilation, respiratory function stable and patient connected to nasal cannula oxygen Cardiovascular status: blood pressure returned to baseline and stable Postop Assessment: no apparent nausea or vomiting Anesthetic complications: no    Last Vitals:  Vitals:   07/28/18 0852 07/28/18 0900  BP:  (!) 123/92  Pulse:  61  Resp:  17  Temp: (!) 36.4 C   SpO2:  99%    Last Pain:  Vitals:   07/28/18 0900  TempSrc:   PainSc: 0-No pain                 Yoshiko Keleher DAVID

## 2018-07-28 NOTE — H&P (Signed)
History of Present Illness  The patient is a 22 year old female who presents with a complaint of cyst. Patient is a 22 year old female who is [facility], [facility] for chief complaint of a umbilical sebaceous cyst. Patient states that she's had these there for several months. He states that over the last couple months she's noticed that his changed colors and become more bothersome. She states sore nature. She states that she's had no drainage. She states it is not changed with her monthly cycles. Patient underwent ultrasound which did reveal subcutaneous sebaceous cyst versus fat necrosis versus less likely hematoma. There is no hernia seen. I reviewed this personally.     Past Surgical History Oral Surgery   Diagnostic Studies History  Colonoscopy  never Mammogram  never Pap Smear  never  Allergies No Known Drug Allergies [06/09/2018]: Allergies Reconciled   Medication History  Tri-Sprintec (0.18/0.215/0.25MG -35 MCG Tablet, Oral) Active. Medications Reconciled  Social History  Alcohol use  Occasional alcohol use. No caffeine use  No drug use  Tobacco use  Never smoker.  Family History Arthritis  Family Members In General. Diabetes Mellitus  Family Members In General. Heart disease in female family member before age 23  Hypertension  Family Membe   Age at menarche  12 years. Contraceptive History  Oral contraceptives. Gravida  0 Para  0 Regular periods   Other Problems Umbilical Hernia Repair     Review of Systems  General Not Present- Appetite Loss, Chills, Fatigue, Fever, Night Sweats, Weight Gain and Weight Loss. Skin Present- Dryness. Not Present- Change in Wart/Mole, Hives, Jaundice, New Lesions, Non-Healing Wounds, Rash and Ulcer. HEENT Present- Seasonal Allergies. Not Present- Earache, Hearing Loss, Hoarseness, Nose Bleed, Oral Ulcers, Ringing in the Ears, Sinus Pain, Sore Throat, Visual Disturbances, Wears  glasses/contact lenses and Yellow Eyes. Respiratory Not Present- Bloody sputum, Chronic Cough, Difficulty Breathing, Snoring and Wheezing. Breast Not Present- Breast Mass, Breast Pain, Nipple Discharge and Skin Changes. Cardiovascular Not Present- Chest Pain, Difficulty Breathing Lying Down, Leg Cramps, Palpitations, Rapid Heart Rate, Shortness of Breath and Swelling of Extremities. Gastrointestinal Present- Change in Bowel Habits. Not Present- Abdominal Pain, Bloating, Bloody Stool, Chronic diarrhea, Constipation, Difficulty Swallowing, Excessive gas, Gets full quickly at meals, Hemorrhoids, Indigestion, Nausea, Rectal Pain and Vomiting. Female Genitourinary Not Present- Frequency, Nocturia, Painful Urination, Pelvic Pain and Urgency. Musculoskeletal Not Present- Back Pain, Joint Pain, Joint Stiffness, Muscle Pain, Muscle Weakness and Swelling of Extremities. Neurological Not Present- Decreased Memory, Fainting, Headaches, Numbness, Seizures, Tingling, Tremor, Trouble walking and Weakness. Psychiatric Not Present- Anxiety, Bipolar, Change in Sleep Pattern, Depression, Fearful and Frequent crying. Endocrine Not Present- Cold Intolerance, Excessive Hunger, Hair Changes, Heat Intolerance, Hot flashes and New Diabetes. Hematology Not Present- Blood Thinners, Easy Bruising, Excessive bleeding, Gland problems, HIV and Persistent Infections. All other systems negative  Vitals  BP 140/86   Pulse 71   Temp 98.4 F (36.9 C) (Oral)   Resp 20   Ht 5\' 5"  (1.651 m)   Wt 92.5 kg   LMP 07/18/2018   SpO2 100%   BMI 33.95 kg/m    Physical Exam  The physical exam findings are as follows: Note:Constitutional: No acute distress, conversant, appears stated age  Eyes: Anicteric sclerae, moist conjunctiva, no lid lag  Neck: No thyromegaly, trachea midline, no cervical lymphadenopathy  Lungs: Clear to auscultation biilaterally, normal respiratory effot  Cardiovascular: regular rate & rhythm, no  murmurs, no peripheal edema, pedal pulses 2+  GI: Soft, no masses or hepatosplenomegaly,  non-tender to palpation, left lateral cyst, approximately 2 x 1 cm,  MSK: Normal gait, no clubbing cyanosis, edema  Skin: No rashes, palpation reveals normal skin turgor  Psychiatric: Appropriate judgment and insight, oriented to person, place, and time    Assessment & Plan CYST OF SKIN (L72.9) Impression: 22 year old female with a left lateral umbilical cyst.  1. Will proceed to the OR For excision of cyst. 2. Discussed with her the risks and benefits of the procedure to include but not limited to: Infection, bleeding, demonstrating treasure, possible recurrence. Patient was understanding and wishes to proceed.

## 2018-07-29 ENCOUNTER — Encounter (HOSPITAL_COMMUNITY): Payer: Self-pay | Admitting: General Surgery

## 2018-08-25 ENCOUNTER — Telehealth: Payer: Self-pay | Admitting: Adult Health

## 2018-08-25 NOTE — Telephone Encounter (Signed)
Patient called request Rx refill on: Norgestimate-Ethinyl Estradiol Triphasic (ORTHO TRI-CYCLEN, 28,) 0.18/0.215/0.25 MG-35 MCG tablet [[id number]][[id number]]   Order Details  Dose, Route, Frequency: As Directed   Dispense Quantity: 1 Package Refills: 11 Fills remaining: --        Sig:    Ortho Tri-Cyclen (28) 0.18 mg(7)/0.215 mg(7)/0.25 mg(7)-35 mcg tablet  Take 1 tablet every day by oral route.    Patient taking differently: Take 1 tablet by mouth every evening. Ortho Tri-Cyclen (28) 0.18 mg(7)/0.215 mg(7)/0.25 mg(7)-35 mcg tablet  Take 1 tablet every day by oral route.     Patient uses :   [id number] [place], [place] - [id number] [facility] [address] [phone])   --Forwarding message to medical assistant.  --Fausto Skillernglh

## 2018-08-26 ENCOUNTER — Other Ambulatory Visit: Payer: Self-pay

## 2018-08-26 NOTE — Telephone Encounter (Signed)
°  [facility]--( Med Asst out of office it was orginaly sent to).   Norgestimate-Ethinyl Estradiol Triphasic (ORTHO TRI-CYCLEN, 28,) 0.18/0.215/0.25 MG-35 MCG tablet [[id number]][[id number]]   Order Details  Dose, Route, Frequency: As Directed   Dispense Quantity: 1 Package Refills: 11 Fills remaining: --        Sig:    Ortho Tri-Cyclen (28) 0.18 mg(7)/0.215 mg(7)/0.25 mg(7)-35 mcg tablet  Take 1 tablet every day by oral route.    Patient taking differently: Take 1 tablet by mouth every evening. Ortho Tri-Cyclen (28) 0.18 mg(7)/0.215 mg(7)/0.25 mg(7)-35 mcg tablet  Take 1 tablet every day by oral route.     pls send to: Preferred Pharmacies      [id number] [place]- [place], [place] - [address] [facility] [address] [phone] (Phone) [phone] (Fax)   --glh

## 2018-08-26 NOTE — Telephone Encounter (Signed)
Error. MPulliam, CMA/RT(R)  

## 2018-08-26 NOTE — Telephone Encounter (Signed)
Reviewed chart,  Patient has refills. Notified patient to contact pharmacy MPulliam, CMA/RT(R)

## 2018-08-27 ENCOUNTER — Telehealth: Payer: Self-pay | Admitting: Adult Health

## 2018-08-27 ENCOUNTER — Other Ambulatory Visit: Payer: Self-pay

## 2018-08-27 DIAGNOSIS — IMO0001 Reserved for inherently not codable concepts without codable children: Secondary | ICD-10-CM

## 2018-08-27 MED ORDER — NORGESTIM-ETH ESTRAD TRIPHASIC 0.18/0.215/0.25 MG-35 MCG PO TABS: 1.0000 | ORAL_TABLET | Freq: Every day | ORAL | 11 refills | Status: DC

## 2018-08-27 MED ORDER — NORGESTIM-ETH ESTRAD TRIPHASIC 0.18/0.215/0.25 MG-35 MCG PO TABS: ORAL_TABLET | ORAL | 11 refills | Status: DC

## 2018-08-27 NOTE — Telephone Encounter (Signed)
Pt called a 2nd time states paper Rx given 05/2018 [facility](loss in transition)  I called : [id number] Ginette Otto- [place], [place] - [address] AT [facility] [address] [phone]) [phone] (Fax)   Confirmed they did not receive 05/27/2018 Rx from patient ,per pharmacist no record of receiving prescription since 08/2017 & we will have to send a new script for patient's : Norgestimate-Ethinyl Estradiol Triphasic (ORTHO TRI-CYCLEN, 28,) 0.18/0.215/0.25 MG-35 MCG tablet [[id number]][[id number]]   Order Details  Dose, Route, Frequency: As Directed   Dispense Quantity: 1 Package Refills: 11 Fills remaining: --        Sig:    Ortho Tri-Cyclen (28) 0.18 mg(7)/0.215 mg(7)/0.25 mg(7)-35 mcg tablet  Take 1 tablet every day by oral route.    Patient taking differently: Take 1 tablet by mouth every evening. Ortho Tri-Cyclen (28) 0.18 mg(7)/0.215 mg(7)/0.25 mg(7)-35 mcg tablet  Take 1 tablet every day by oral route.     --Forwarding message to medical assistant & provider.  --glh

## 2018-08-27 NOTE — Telephone Encounter (Signed)
Refill sent in and patient notified.  Verified with the pharmacy that they had not received the pervious RX. MPulliam, CMA/RT(R)

## 2018-09-18 ENCOUNTER — Other Ambulatory Visit (INDEPENDENT_AMBULATORY_CARE_PROVIDER_SITE_OTHER): Payer: No Typology Code available for payment source

## 2018-09-18 DIAGNOSIS — Z0000 Encounter for general adult medical examination without abnormal findings: Secondary | ICD-10-CM

## 2018-09-19 LAB — HEMOGLOBIN A1C
Est. average glucose Bld gHb Est-mCnc: 105 mg/dL
Hgb A1c MFr Bld: 5.3 % (ref 4.8–5.6)

## 2018-09-19 LAB — LIPID PANEL
Chol/HDL Ratio: 1.5 ratio (ref 0.0–4.4)
Cholesterol, Total: 157 mg/dL (ref 100–199)
HDL: 108 mg/dL
LDL Calculated: 40 mg/dL (ref 0–99)
Triglycerides: 46 mg/dL (ref 0–149)
VLDL Cholesterol Cal: 9 mg/dL (ref 5–40)

## 2018-09-19 LAB — CBC WITH DIFFERENTIAL/PLATELET
Basophils Absolute: 0 10*3/uL (ref 0.0–0.2)
Basos: 1 %
EOS (ABSOLUTE): 0.5 10*3/uL — ABNORMAL HIGH (ref 0.0–0.4)
EOS: 7 %
HEMATOCRIT: 37.6 % (ref 34.0–46.6)
HEMOGLOBIN: 12.3 g/dL (ref 11.1–15.9)
Immature Grans (Abs): 0 10*3/uL (ref 0.0–0.1)
Immature Granulocytes: 0 %
LYMPHS ABS: 1.6 10*3/uL (ref 0.7–3.1)
Lymphs: 24 %
MCH: 25.9 pg — AB (ref 26.6–33.0)
MCHC: 32.7 g/dL (ref 31.5–35.7)
MCV: 79 fL (ref 79–97)
MONOCYTES: 8 %
MONOS ABS: 0.5 10*3/uL (ref 0.1–0.9)
NEUTROS ABS: 4 10*3/uL (ref 1.4–7.0)
Neutrophils: 60 %
Platelets: 260 10*3/uL (ref 150–450)
RBC: 4.74 x10E6/uL (ref 3.77–5.28)
RDW: 13.8 % (ref 12.3–15.4)
WBC: 6.6 10*3/uL (ref 3.4–10.8)

## 2018-09-19 LAB — COMPREHENSIVE METABOLIC PANEL
A/G RATIO: 1.5 (ref 1.2–2.2)
ALT: 9 IU/L (ref 0–32)
AST: 14 IU/L (ref 0–40)
Albumin: 4.1 g/dL (ref 3.5–5.5)
Alkaline Phosphatase: 54 IU/L (ref 39–117)
BUN/Creatinine Ratio: 11 (ref 9–23)
BUN: 9 mg/dL (ref 6–20)
Bilirubin Total: 0.2 mg/dL (ref 0.0–1.2)
CALCIUM: 9.3 mg/dL (ref 8.7–10.2)
CO2: 24 mmol/L (ref 20–29)
Chloride: 105 mmol/L (ref 96–106)
Creatinine, Ser: 0.83 mg/dL (ref 0.57–1.00)
GFR, EST AFRICAN AMERICAN: 115 mL/min/{1.73_m2} (ref >59)
GFR, EST NON AFRICAN AMERICAN: 100 mL/min/{1.73_m2} (ref >59)
GLOBULIN, TOTAL: 2.8 g/dL (ref 1.5–4.5)
GLUCOSE: 86 mg/dL (ref 65–99)
POTASSIUM: 4.4 mmol/L (ref 3.5–5.2)
Sodium: 142 mmol/L (ref 134–144)
Total Protein: 6.9 g/dL (ref 6.0–8.5)

## 2018-09-19 LAB — TSH: TSH: 1.91 u[IU]/mL (ref 0.450–4.500)

## 2018-09-24 NOTE — Progress Notes (Signed)
Subjective:    Patient ID: Faith Bush, female    DOB: [birth year], 22 y.o.   MRN: [id number]  HPI:05/27/18 OV:   [name] is here to establish as a new pt.  She is a pleasant 22 year old female.   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 She denies lifting heavy objects or acute trauma prior to increase in sx's. Sx's include- intermittent pain with palpation and "a spot that turned black" a few weeks ago. She denies fever/poor appetite/change in bowel or bladder habits She denies tobacco/ETOH use She denies formal exercise She works PT at [employer] as Conservation officer, nature and will start graduate school at [employer] next month  09/25/18 OV: [name] is here for CPE 07/28/18 Successful excision of umbilical sebaceous cyst. She denies acute complaints She estimates to drink >70 oz water/day and has been trying to improve portion control of meals, she lost 7 lbs since last OV in June 2019 She denies formal exercise, however walks a lot daily for school/work She denies tobacco/vape use She seldom drinks EOTH She denies personal/family hx of migraines  Healthcare Maintenance: PAP-UTD, due 2020 Immunizations-UTD  Patient Care Team    Relationship Specialty Notifications Start End  Julaine Fusi, NP PCP - General Family Medicine  05/06/18     Patient Active Problem List   Diagnosis Date Noted  . Healthcare maintenance 05/27/2018  . Eczema 08/12/2017     Past Medical History:  Diagnosis Date  . Eczema   . Medical history non-contributory      Past Surgical History:  Procedure Laterality Date  . EXCISION OF KELOID N/A 07/28/2018   Procedure: EXCISION OF UMBILICAL MASS / CYST;  Surgeon: Axel Filler, MD;  Location: [facility] OR;  Service: General;  Laterality: N/A;  . WISDOM TOOTH EXTRACTION       History reviewed. No pertinent family history.   Social History   Substance and Sexual Activity  Drug Use Never      Social History   Substance and Sexual Activity  Alcohol Use Yes   Comment: 3 drinks/week     Social History   Tobacco Use  Smoking Status Never Smoker  Smokeless Tobacco Never Used     Outpatient Encounter Medications as of 09/25/2018  Medication Sig  . ibuprofen (ADVIL,MOTRIN) 200 MG tablet Take 400 mg by mouth every 8 (eight) hours as needed (for pain.).  Marland Kitchen Multiple Vitamin (MULTIVITAMIN WITH MINERALS) TABS tablet Take 1 tablet by mouth daily.  . Norgestimate-Ethinyl Estradiol Triphasic (ORTHO TRI-CYCLEN, 28,) 0.18/0.215/0.25 MG-35 MCG tablet Take 1 tablet by mouth daily.  . [DISCONTINUED] traMADol (ULTRAM) 50 MG tablet Take 1 tablet (50 mg total) by mouth every 6 (six) hours as needed.   No facility-administered encounter medications on file as of 09/25/2018.     Allergies: Patient has no known allergies.  Body mass index is 32.88 kg/m.  Blood pressure 123/82, pulse 74, height 5\' 5"  (1.651 m), weight 197 lb 9.6 oz (89.6 kg), SpO2 99 %.  Review of Systems  Constitutional: Negative for activity change, appetite change, chills, diaphoresis, fatigue, fever and unexpected weight change.  Eyes: Negative for visual disturbance.  Respiratory: Negative for cough, chest tightness, shortness of breath, wheezing and stridor.   Cardiovascular: Negative for chest pain, palpitations and leg swelling.  Gastrointestinal: Negative for abdominal distention, abdominal pain, blood in stool, constipation, diarrhea, nausea and vomiting.  Endocrine: Negative for cold intolerance, heat intolerance, polydipsia,  polyphagia and polyuria.  Genitourinary: Negative for difficulty urinating, flank pain and hematuria.  Musculoskeletal: Negative for arthralgias, back pain, gait problem, joint swelling, myalgias, neck pain and neck stiffness.  Skin: Negative for color change, pallor, rash and wound.  Neurological: Negative for dizziness and headaches.  Hematological: Does not bruise/bleed  easily.  Psychiatric/Behavioral: Negative for hallucinations, self-injury, sleep disturbance and suicidal ideas. The patient is not nervous/anxious and is not hyperactive.        Objective:   Physical Exam  Constitutional: She is oriented to person, place, and time. She appears well-developed and well-nourished. No distress.  HENT:  Head: Normocephalic and atraumatic.  Right Ear: External ear normal. Tympanic membrane is not erythematous and not bulging. No decreased hearing is noted.  Left Ear: External ear normal. Tympanic membrane is not erythematous and not bulging. No decreased hearing is noted.  Nose: Nose normal. No mucosal edema. Right sinus exhibits no maxillary sinus tenderness and no frontal sinus tenderness. Left sinus exhibits no maxillary sinus tenderness and no frontal sinus tenderness.  Mouth/Throat: Oropharynx is clear and moist and mucous membranes are normal. Normal dentition. No oropharyngeal exudate, posterior oropharyngeal edema, posterior oropharyngeal erythema or tonsillar abscesses. Tonsils are 0 on the right. Tonsils are 0 on the left. No tonsillar exudate.  Eyes: Pupils are equal, round, and reactive to light. Conjunctivae and EOM are normal.  Neck: Normal range of motion. Neck supple.  Cardiovascular: Normal rate, regular rhythm, normal heart sounds and intact distal pulses.  No murmur heard. Pulmonary/Chest: Effort normal and breath sounds normal. No stridor. No respiratory distress. She has no decreased breath sounds. She has no wheezes. She has no rhonchi. She has no rales. She exhibits no tenderness. Right breast exhibits no inverted nipple, no mass, no nipple discharge, no skin change and no tenderness. Left breast exhibits no inverted nipple, no mass, no nipple discharge, no skin change and no tenderness.  Reddened, enlarged hair follicle noted in L axillary area  Abdominal: Soft. Bowel sounds are normal. She exhibits no distension and no mass. There is no  tenderness. There is no rebound, no guarding, no tenderness at McBurney's point and negative Murphy's sign. No hernia.  Musculoskeletal: Normal range of motion. She exhibits no edema or tenderness.  Lymphadenopathy:    She has no cervical adenopathy.  Neurological: She is alert and oriented to person, place, and time. Coordination normal.  Skin: Skin is warm and dry. Capillary refill takes less than 2 seconds. No rash noted. She is not diaphoretic. No erythema. No pallor.  Psychiatric: She has a normal mood and affect. Her behavior is normal. Judgment and thought content normal.  Nursing note and vitals reviewed.     Assessment & Plan:   1. Need for influenza vaccination   2. Screening for HIV (human immunodeficiency virus)   3. Healthcare maintenance     Healthcare maintenance Increase water intake, strive for at least 100 ounces/day.   Follow Mediterranean Diet. Increase regular exercise.  Recommend at least 30 minutes daily, 5 days per week of walking, jogging, biking, swimming, YouTube/Pinterest workout videos. Overall your labs are excellent. When you need refill on your birth control, please call pharmacy and they will reach out to [place]. Recommend annual physical with fasting labs.   FOLLOW-UP:  Return in about 1 year (around 09/26/2019) for Fasting Labs, CPE.

## 2018-09-25 ENCOUNTER — Ambulatory Visit (INDEPENDENT_AMBULATORY_CARE_PROVIDER_SITE_OTHER): Payer: PRIVATE HEALTH INSURANCE | Admitting: Adult Health

## 2018-09-25 ENCOUNTER — Encounter: Payer: Self-pay | Admitting: Adult Health

## 2018-09-25 VITALS — BP 123/82 | HR 74 | Ht 65.0 in | Wt 197.6 lb

## 2018-09-25 DIAGNOSIS — Z114 Encounter for screening for human immunodeficiency virus [HIV]: Secondary | ICD-10-CM

## 2018-09-25 DIAGNOSIS — Z23 Encounter for immunization: Secondary | ICD-10-CM | POA: Diagnosis not present

## 2018-09-25 DIAGNOSIS — Z0000 Encounter for general adult medical examination without abnormal findings: Secondary | ICD-10-CM

## 2018-09-25 NOTE — Assessment & Plan Note (Signed)
Increase water intake, strive for at least 100 ounces/day.   Follow Mediterranean Diet. Increase regular exercise.  Recommend at least 30 minutes daily, 5 days per week of walking, jogging, biking, swimming, YouTube/Pinterest workout videos. Overall your labs are excellent. When you need refill on your birth control, please call pharmacy and they will reach out to [place]. Recommend annual physical with fasting labs.

## 2018-09-25 NOTE — Patient Instructions (Signed)
Preventive Care for Adults, Female  A healthy lifestyle and preventive care can promote health and wellness. Preventive health guidelines for women include the following key practices.   A routine yearly physical is a good way to check with your health care provider about your health and preventive screening. It is a chance to share any concerns and updates on your health and to receive a thorough exam.   Visit your dentist for a routine exam and preventive care every 6 months. Brush your teeth twice a day and floss once a day. Good oral hygiene prevents tooth decay and gum disease.   The frequency of eye exams is based on your age, health, family medical history, use of contact lenses, and other factors. Follow your health care provider's recommendations for frequency of eye exams.   Eat a healthy diet. Foods like vegetables, fruits, whole grains, low-fat dairy products, and lean protein foods contain the nutrients you need without too many calories. Decrease your intake of foods high in solid fats, added sugars, and salt. Eat the right amount of calories for you.Get information about a proper diet from your health care provider, if necessary.   Regular physical exercise is one of the most important things you can do for your health. Most adults should get at least 150 minutes of moderate-intensity exercise (any activity that increases your heart rate and causes you to sweat) each week. In addition, most adults need muscle-strengthening exercises on 2 or more days a week.   Maintain a healthy weight. The body mass index (BMI) is a screening tool to identify possible weight problems. It provides an estimate of body fat based on height and weight. Your health care provider can find your BMI, and can help you achieve or maintain a healthy weight.For adults 20 years and older:   - A BMI below 18.5 is considered underweight.   - A BMI of 18.5 to 24.9 is normal.   - A BMI of 25 to 29.9 is  considered overweight.   - A BMI of 30 and above is considered obese.   Maintain normal blood lipids and cholesterol levels by exercising and minimizing your intake of trans and saturated fats.  Eat a balanced diet with plenty of fruit and vegetables. Blood tests for lipids and cholesterol should begin at age 20 and be repeated every 5 years minimum.  If your lipid or cholesterol levels are high, you are over 40, or you are at high risk for heart disease, you may need your cholesterol levels checked more frequently.Ongoing high lipid and cholesterol levels should be treated with medicines if diet and exercise are not working.   If you smoke, find out from your health care provider how to quit. If you do not use tobacco, do not start.   Lung cancer screening is recommended for adults aged 55-80 years who are at high risk for developing lung cancer because of a history of smoking. A yearly low-dose CT scan of the lungs is recommended for people who have at least a 30-pack-year history of smoking and are a current smoker or have quit within the past 15 years. A pack year of smoking is smoking an average of 1 pack of cigarettes a day for 1 year (for example: 1 pack a day for 30 years or 2 packs a day for 15 years). Yearly screening should continue until the smoker has stopped smoking for at least 15 years. Yearly screening should be stopped for people who develop a   health problem that would prevent them from having lung cancer treatment.   If you are pregnant, do not drink alcohol. If you are breastfeeding, be very cautious about drinking alcohol. If you are not pregnant and choose to drink alcohol, do not have more than 1 drink per day. One drink is considered to be 12 ounces (355 mL) of beer, 5 ounces (148 mL) of wine, or 1.5 ounces (44 mL) of liquor.   Avoid use of street drugs. Do not share needles with anyone. Ask for help if you need support or instructions about stopping the use of  drugs.   High blood pressure causes heart disease and increases the risk of stroke. Your blood pressure should be checked at least yearly.  Ongoing high blood pressure should be treated with medicines if weight loss and exercise do not work.   If you are 69-55 years old, ask your health care provider if you should take aspirin to prevent strokes.   Diabetes screening involves taking a blood sample to check your fasting blood sugar level. This should be done once every 3 years, after age 38, if you are within normal weight and without risk factors for diabetes. Testing should be considered at a younger age or be carried out more frequently if you are overweight and have at least 1 risk factor for diabetes.   Breast cancer screening is essential preventive care for women. You should practice "breast self-awareness."  This means understanding the normal appearance and feel of your breasts and may include breast self-examination.  Any changes detected, no matter how small, should be reported to a health care provider.  Women in their 80s and 30s should have a clinical breast exam (CBE) by a health care provider as part of a regular health exam every 1 to 3 years.  After age 66, women should have a CBE every year.  Starting at age 1, women should consider having a mammogram (breast X-ray test) every year.  Women who have a family history of breast cancer should talk to their health care provider about genetic screening.  Women at a high risk of breast cancer should talk to their health care providers about having an MRI and a mammogram every year.   -Breast cancer gene (BRCA)-related cancer risk assessment is recommended for women who have family members with BRCA-related cancers. BRCA-related cancers include breast, ovarian, tubal, and peritoneal cancers. Having family members with these cancers may be associated with an increased risk for harmful changes (mutations) in the breast cancer genes BRCA1 and  BRCA2. Results of the assessment will determine the need for genetic counseling and BRCA1 and BRCA2 testing.   The Pap test is a screening test for cervical cancer. A Pap test can show cell changes on the cervix that might become cervical cancer if left untreated. A Pap test is a procedure in which cells are obtained and examined from the lower end of the uterus (cervix).   - Women should have a Pap test starting at age 57.   - Between ages 90 and 70, Pap tests should be repeated every 2 years.   - Beginning at age 63, you should have a Pap test every 3 years as long as the past 3 Pap tests have been normal.   - Some women have medical problems that increase the chance of getting cervical cancer. Talk to your health care provider about these problems. It is especially important to talk to your health care provider if a  new problem develops soon after your last Pap test. In these cases, your health care provider may recommend more frequent screening and Pap tests.   - The above recommendations are the same for women who have or have not gotten the vaccine for human papillomavirus (HPV).   - If you had a hysterectomy for a problem that was not cancer or a condition that could lead to cancer, then you no longer need Pap tests. Even if you no longer need a Pap test, a regular exam is a good idea to make sure no other problems are starting.   - If you are between ages 36 and 66 years, and you have had normal Pap tests going back 10 years, you no longer need Pap tests. Even if you no longer need a Pap test, a regular exam is a good idea to make sure no other problems are starting.   - If you have had past treatment for cervical cancer or a condition that could lead to cancer, you need Pap tests and screening for cancer for at least 20 years after your treatment.   - If Pap tests have been discontinued, risk factors (such as a new sexual partner) need to be reassessed to determine if screening should  be resumed.   - The HPV test is an additional test that may be used for cervical cancer screening. The HPV test looks for the virus that can cause the cell changes on the cervix. The cells collected during the Pap test can be tested for HPV. The HPV test could be used to screen women aged 70 years and older, and should be used in women of any age who have unclear Pap test results. After the age of 67, women should have HPV testing at the same frequency as a Pap test.   Colorectal cancer can be detected and often prevented. Most routine colorectal cancer screening begins at the age of 57 years and continues through age 26 years. However, your health care provider may recommend screening at an earlier age if you have risk factors for colon cancer. On a yearly basis, your health care provider may provide home test kits to check for hidden blood in the stool.  Use of a small camera at the end of a tube, to directly examine the colon (sigmoidoscopy or colonoscopy), can detect the earliest forms of colorectal cancer. Talk to your health care provider about this at age 23, when routine screening begins. Direct exam of the colon should be repeated every 5 -10 years through age 49 years, unless early forms of pre-cancerous polyps or small growths are found.   People who are at an increased risk for hepatitis B should be screened for this virus. You are considered at high risk for hepatitis B if:  -You were born in a country where hepatitis B occurs often. Talk with your health care provider about which countries are considered high risk.  - Your parents were born in a high-risk country and you have not received a shot to protect against hepatitis B (hepatitis B vaccine).  - You have HIV or AIDS.  - You use needles to inject street drugs.  - You live with, or have sex with, someone who has Hepatitis B.  - You get hemodialysis treatment.  - You take certain medicines for conditions like cancer, organ  transplantation, and autoimmune conditions.   Hepatitis C blood testing is recommended for all people born from [birth year] through 1965 and any individual  with known risks for hepatitis C.   Practice safe sex. Use condoms and avoid high-risk sexual practices to reduce the spread of sexually transmitted infections (STIs). STIs include gonorrhea, chlamydia, syphilis, trichomonas, herpes, HPV, and human immunodeficiency virus (HIV). Herpes, HIV, and HPV are viral illnesses that have no cure. They can result in disability, cancer, and death. Sexually active women aged 47 years and younger should be checked for chlamydia. Older women with new or multiple partners should also be tested for chlamydia. Testing for other STIs is recommended if you are sexually active and at increased risk.   Osteoporosis is a disease in which the bones lose minerals and strength with aging. This can result in serious bone fractures or breaks. The risk of osteoporosis can be identified using a bone density scan. Women ages 34 years and over and women at risk for fractures or osteoporosis should discuss screening with their health care providers. Ask your health care provider whether you should take a calcium supplement or vitamin D to There are also several preventive steps women can take to avoid osteoporosis and resulting fractures or to keep osteoporosis from worsening. -->Recommendations include:  Eat a balanced diet high in fruits, vegetables, calcium, and vitamins.  Get enough calcium. The recommended total intake of is 1,200 mg daily; for best absorption, if taking supplements, divide doses into 250-500 mg doses throughout the day. Of the two types of calcium, calcium carbonate is best absorbed when taken with food but calcium citrate can be taken on an empty stomach.  Get enough vitamin D. [employer] and the [employer] recommend at least 1,000 IU per day for women age 65 and over who are at risk of vitamin D  deficiency. Vitamin D deficiency can be caused by inadequate sun exposure (for example, those who live in [place]).  Avoid alcohol and smoking. Heavy alcohol intake (more than 7 drinks per week) increases the risk of falls and hip fracture and women smokers tend to lose bone more rapidly and have lower bone mass than nonsmokers. Stopping smoking is one of the most important changes women can make to improve their health and decrease risk for disease.  Be physically active every day. Weight-bearing exercise (for example, fast walking, hiking, jogging, and weight training) may strengthen bones or slow the rate of bone loss that comes with aging. Balancing and muscle-strengthening exercises can reduce the risk of falling and fracture.  Consider therapeutic medications. Currently, several types of effective drugs are available. Healthcare providers can recommend the type most appropriate for each woman.  Eliminate environmental factors that may contribute to accidents. Falls cause nearly 90% of all osteoporotic fractures, so reducing this risk is an important bone-health strategy. Measures include ample lighting, removing obstructions to walking, using nonskid rugs on floors, and placing mats and/or grab bars in showers.  Be aware of medication side effects. Some common medicines make bones weaker. These include a type of steroid drug called glucocorticoids used for arthritis and asthma, some antiseizure drugs, certain sleeping pills, treatments for endometriosis, and some cancer drugs. An overactive thyroid gland or using too much thyroid hormone for an underactive thyroid can also be a problem. If you are taking these medicines, talk to your doctor about what you can do to help protect your bones.reduce the rate of osteoporosis.    Menopause can be associated with physical symptoms and risks. Hormone replacement therapy is available to decrease symptoms and risks. You should talk to your  health care provider  about whether hormone replacement therapy is right for you.   Use sunscreen. Apply sunscreen liberally and repeatedly throughout the day. You should seek shade when your shadow is shorter than you. Protect yourself by wearing long sleeves, pants, a wide-brimmed hat, and sunglasses year round, whenever you are outdoors.   Once a month, do a whole body skin exam, using a mirror to look at the skin on your back. Tell your health care provider of new moles, moles that have irregular borders, moles that are larger than a pencil eraser, or moles that have changed in shape or color.   -Stay current with required vaccines (immunizations).   Influenza vaccine. All adults should be immunized every year.  Tetanus, diphtheria, and acellular pertussis (Td, Tdap) vaccine. Pregnant women should receive 1 dose of Tdap vaccine during each pregnancy. The dose should be obtained regardless of the length of time since the last dose. Immunization is preferred during the 27th 36th week of gestation. An adult who has not previously received Tdap or who does not know her vaccine status should receive 1 dose of Tdap. This initial dose should be followed by tetanus and diphtheria toxoids (Td) booster doses every 10 years. Adults with an unknown or incomplete history of completing a 3-dose immunization series with Td-containing vaccines should begin or complete a primary immunization series including a Tdap dose. Adults should receive a Td booster every 10 years.  Varicella vaccine. An adult without evidence of immunity to varicella should receive 2 doses or a second dose if she has previously received 1 dose. Pregnant females who do not have evidence of immunity should receive the first dose after pregnancy. This first dose should be obtained before leaving the health care facility. The second dose should be obtained 4 8 weeks after the first dose.  Human papillomavirus (HPV) vaccine. Females aged 13 26  years who have not received the vaccine previously should obtain the 3-dose series. The vaccine is not recommended for use in pregnant females. However, pregnancy testing is not needed before receiving a dose. If a female is found to be pregnant after receiving a dose, no treatment is needed. In that case, the remaining doses should be delayed until after the pregnancy. Immunization is recommended for any person with an immunocompromised condition through the age of 26 years if she did not get any or all doses earlier. During the 3-dose series, the second dose should be obtained 4 8 weeks after the first dose. The third dose should be obtained 24 weeks after the first dose and 16 weeks after the second dose.  Zoster vaccine. One dose is recommended for adults aged 60 years or older unless certain conditions are present.  Measles, mumps, and rubella (MMR) vaccine. Adults born before 1957 generally are considered immune to measles and mumps. Adults born in 1957 or later should have 1 or more doses of MMR vaccine unless there is a contraindication to the vaccine or there is laboratory evidence of immunity to each of the three diseases. A routine second dose of MMR vaccine should be obtained at least 28 days after the first dose for students attending postsecondary schools, health care workers, or international travelers. People who received inactivated measles vaccine or an unknown type of measles vaccine during 1963 1967 should receive 2 doses of MMR vaccine. People who received inactivated mumps vaccine or an unknown type of mumps vaccine before 1979 and are at high risk for mumps infection should consider immunization with 2 doses of   MMR vaccine. For females of childbearing age, rubella immunity should be determined. If there is no evidence of immunity, females who are not pregnant should be vaccinated. If there is no evidence of immunity, females who are pregnant should delay immunization until after pregnancy.  Unvaccinated health care workers born before [birth year] who lack laboratory evidence of measles, mumps, or rubella immunity or laboratory confirmation of disease should consider measles and mumps immunization with 2 doses of MMR vaccine or rubella immunization with 1 dose of MMR vaccine.  Pneumococcal 13-valent conjugate (PCV13) vaccine. When indicated, a person who is uncertain of her immunization history and has no record of immunization should receive the PCV13 vaccine. An adult aged 54 years or older who has certain medical conditions and has not been previously immunized should receive 1 dose of PCV13 vaccine. This PCV13 should be followed with a dose of pneumococcal polysaccharide (PPSV23) vaccine. The PPSV23 vaccine dose should be obtained at least 8 weeks after the dose of PCV13 vaccine. An adult aged 58 years or older who has certain medical conditions and previously received 1 or more doses of PPSV23 vaccine should receive 1 dose of PCV13. The PCV13 vaccine dose should be obtained 1 or more years after the last PPSV23 vaccine dose.  Pneumococcal polysaccharide (PPSV23) vaccine. When PCV13 is also indicated, PCV13 should be obtained first. All adults aged 58 years and older should be immunized. An adult younger than age 65 years who has certain medical conditions should be immunized. Any person who resides in a nursing home or long-term care facility should be immunized. An adult smoker should be immunized. People with an immunocompromised condition and certain other conditions should receive both PCV13 and PPSV23 vaccines. People with human immunodeficiency virus (HIV) infection should be immunized as soon as possible after diagnosis. Immunization during chemotherapy or radiation therapy should be avoided. Routine use of PPSV23 vaccine is not recommended for American Indians, [place] Natives, or people younger than 65 years unless there are medical conditions that require PPSV23 vaccine. When indicated,  people who have unknown immunization and have no record of immunization should receive PPSV23 vaccine. One-time revaccination 5 years after the first dose of PPSV23 is recommended for people aged 70 64 years who have chronic kidney failure, nephrotic syndrome, asplenia, or immunocompromised conditions. People who received 1 2 doses of PPSV23 before age 32 years should receive another dose of PPSV23 vaccine at age 96 years or later if at least 5 years have passed since the previous dose. Doses of PPSV23 are not needed for people immunized with PPSV23 at or after age 55 years.  Meningococcal vaccine. Adults with asplenia or persistent complement component deficiencies should receive 2 doses of quadrivalent meningococcal conjugate (MenACWY-D) vaccine. The doses should be obtained at least 2 months apart. Microbiologists working with certain meningococcal bacteria, [employer] recruits, people at risk during an outbreak, and people who travel to or live in countries with a high rate of meningitis should be immunized. A first-year college student up through age 58 years who is living in a residence hall should receive a dose if she did not receive a dose on or after her 16th birthday. Adults who have certain high-risk conditions should receive one or more doses of vaccine.  Hepatitis A vaccine. Adults who wish to be protected from this disease, have certain high-risk conditions, work with hepatitis A-infected animals, work in hepatitis A research labs, or travel to or work in countries with a high rate of hepatitis A should be  immunized. Adults who were previously unvaccinated and who anticipate close contact with an international adoptee during the first 60 days after arrival in the [place] from a country with a high rate of hepatitis A should be immunized.  Hepatitis B vaccine.  Adults who wish to be protected from this disease, have certain high-risk conditions, may be exposed to blood or other infectious  body fluids, are household contacts or sex partners of hepatitis B positive people, are clients or workers in certain care facilities, or travel to or work in countries with a high rate of hepatitis B should be immunized.  Haemophilus influenzae type b (Hib) vaccine. A previously unvaccinated person with asplenia or sickle cell disease or having a scheduled splenectomy should receive 1 dose of Hib vaccine. Regardless of previous immunization, a recipient of a hematopoietic stem cell transplant should receive a 3-dose series 6 12 months after her successful transplant. Hib vaccine is not recommended for adults with HIV infection.  Preventive Services / Frequency Ages 6 to 39years  Blood pressure check.** / Every 1 to 2 years.  Lipid and cholesterol check.** / Every 5 years beginning at age 39.  Clinical breast exam.** / Every 3 years for women in their 61s and 62s.  BRCA-related cancer risk assessment.** / For women who have family members with a BRCA-related cancer (breast, ovarian, tubal, or peritoneal cancers).  Pap test.** / Every 2 years from ages 47 through 85. Every 3 years starting at age 34 through age 12 or 74 with a history of 3 consecutive normal Pap tests.  HPV screening.** / Every 3 years from ages 46 through ages 43 to 54 with a history of 3 consecutive normal Pap tests.  Hepatitis C blood test.** / For any individual with known risks for hepatitis C.  Skin self-exam. / Monthly.  Influenza vaccine. / Every year.  Tetanus, diphtheria, and acellular pertussis (Tdap, Td) vaccine.** / Consult your health care provider. Pregnant women should receive 1 dose of Tdap vaccine during each pregnancy. 1 dose of Td every 10 years.  Varicella vaccine.** / Consult your health care provider. Pregnant females who do not have evidence of immunity should receive the first dose after pregnancy.  HPV vaccine. / 3 doses over 6 months, if 64 and younger. The vaccine is not recommended for use in  pregnant females. However, pregnancy testing is not needed before receiving a dose.  Measles, mumps, rubella (MMR) vaccine.** / You need at least 1 dose of MMR if you were born in 1957 or later. You may also need a 2nd dose. For females of childbearing age, rubella immunity should be determined. If there is no evidence of immunity, females who are not pregnant should be vaccinated. If there is no evidence of immunity, females who are pregnant should delay immunization until after pregnancy.  Pneumococcal 13-valent conjugate (PCV13) vaccine.** / Consult your health care provider.  Pneumococcal polysaccharide (PPSV23) vaccine.** / 1 to 2 doses if you smoke cigarettes or if you have certain conditions.  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 You may also need additional booster doses.  Hepatitis A vaccine.** / Consult your health care provider.  Hepatitis B vaccine.** / Consult your health care provider.  Haemophilus influenzae type b (Hib) vaccine.** / Consult your health care provider.  Ages 55 to 64years  Blood pressure check.** / Every 1 to 2 years.  Lipid and cholesterol check.** / Every 5 years beginning at age 20 years.  Lung cancer screening. / Every year if you are aged 55 80 years and have a 30-pack-year history of smoking and currently smoke or have quit within the past 15 years. Yearly screening is stopped once you have quit smoking for at least 15 years or develop a health problem that would prevent you from having lung cancer treatment.  Clinical breast exam.** / Every year after age 40 years.  BRCA-related cancer risk assessment.** / For women who have family members with a BRCA-related cancer (breast, ovarian, tubal, or peritoneal cancers).  Mammogram.** / Every year beginning at age 40  years and continuing for as long as you are in good health. Consult with your health care provider.  Pap test.** / Every 3 years starting at age 30 years through age 65 or 70 years with a history of 3 consecutive normal Pap tests.  HPV screening.** / Every 3 years from ages 30 years through ages 65 to 70 years with a history of 3 consecutive normal Pap tests.  Fecal occult blood test (FOBT) of stool. / Every year beginning at age 50 years and continuing until age 75 years. You may not need to do this test if you get a colonoscopy every 10 years.  Flexible sigmoidoscopy or colonoscopy.** / Every 5 years for a flexible sigmoidoscopy or every 10 years for a colonoscopy beginning at age 50 years and continuing until age 75 years.  Hepatitis C blood test.** / For all people born from 1945 through 1965 and any individual with known risks for hepatitis C.  Skin self-exam. / Monthly.  Influenza vaccine. / Every year.  Tetanus, diphtheria, and acellular pertussis (Tdap/Td) vaccine.** / Consult your health care provider. Pregnant women should receive 1 dose of Tdap vaccine during each pregnancy. 1 dose of Td every 10 years.  Varicella vaccine.** / Consult your health care provider. Pregnant females who do not have evidence of immunity should receive the first dose after pregnancy.  Zoster vaccine.** / 1 dose for adults aged 60 years or older.  Measles, mumps, rubella (MMR) vaccine.** / You need at least 1 dose of MMR if you were born in 1957 or later. You may also need a 2nd dose. For females of childbearing age, rubella immunity should be determined. If there is no evidence of immunity, females who are not pregnant should be vaccinated. If there is no evidence of immunity, females who are pregnant should delay immunization until after pregnancy.  Pneumococcal 13-valent conjugate (PCV13) vaccine.** / Consult your health care provider.  Pneumococcal polysaccharide (PPSV23) vaccine.** / 1 to 2 doses if  you smoke cigarettes or if you have certain conditions.  Meningococcal vaccine.** / Consult your health care provider.  Hepatitis A vaccine.** / Consult your health care provider.  Hepatitis B vaccine.** / Consult your health care provider.  Haemophilus influenzae type b (Hib) vaccine.** / Consult your health care provider.  Ages 65 years and over  Blood pressure check.** / Every 1 to 2 years.  Lipid and cholesterol check.** / Every 5 years beginning at age 20 years.  Lung cancer screening. / Every year if you are aged 55 80 years and have a 30-pack-year history of smoking and currently smoke or have quit within the past 15 years. Yearly screening is stopped once you have quit smoking for at least 15 years or develop a health problem that   would prevent you from having lung cancer treatment.  Clinical breast exam.** / Every year after age 47 years.  BRCA-related cancer risk assessment.** / For women who have family members with a BRCA-related cancer (breast, ovarian, tubal, or peritoneal cancers).  Mammogram.** / Every year beginning at age 76 years and continuing for as long as you are in good health. Consult with your health care provider.  Pap test.** / Every 3 years starting at age 67 years through age 108 or 52 years with 3 consecutive normal Pap tests. Testing can be stopped between 65 and 70 years with 3 consecutive normal Pap tests and no abnormal Pap or HPV tests in the past 10 years.  HPV screening.** / Every 3 years from ages 71 years through ages 72 or 23 years with a history of 3 consecutive normal Pap tests. Testing can be stopped between 65 and 70 years with 3 consecutive normal Pap tests and no abnormal Pap or HPV tests in the past 10 years.  Fecal occult blood test (FOBT) of stool. / Every year beginning at age 26 years and continuing until age 32 years. You may not need to do this test if you get a colonoscopy every 10 years.  Flexible sigmoidoscopy or colonoscopy.** /  Every 5 years for a flexible sigmoidoscopy or every 10 years for a colonoscopy beginning at age 57 years and continuing until age 63 years.  Hepatitis C blood test.** / For all people born from [birth year] through 1966 and any individual with known risks for hepatitis C.  Osteoporosis screening.** / A one-time screening for women ages 33 years and over and women at risk for fractures or osteoporosis.  Skin self-exam. / Monthly.  Influenza vaccine. / Every year.  Tetanus, diphtheria, and acellular pertussis (Tdap/Td) vaccine.** / 1 dose of Td every 10 years.  Varicella vaccine.** / Consult your health care provider.  Zoster vaccine.** / 1 dose for adults aged 35 years or older.  Pneumococcal 13-valent conjugate (PCV13) vaccine.** / Consult your health care provider.  Pneumococcal polysaccharide (PPSV23) vaccine.** / 1 dose for all adults aged 50 years and older.  Meningococcal vaccine.** / Consult your health care provider.  Hepatitis A vaccine.** / Consult your health care provider.  Hepatitis B vaccine.** / Consult your health care provider.  Haemophilus influenzae type b (Hib) vaccine.** / Consult your health care provider. ** Family history and personal history of risk and conditions may change your health care provider's recommendations. Document Released: 01/22/2002 Document Revised: 09/16/2013  [facility] Patient Information 2014 [place], [place].   EXERCISE AND DIET:  We recommended that you start or continue a regular exercise program for good health. Regular exercise means any activity that makes your heart beat faster and makes you sweat.  We recommend exercising at least 30 minutes per day at least 3 days a week, preferably 5.  We also recommend a diet low in fat and sugar / carbohydrates.  Inactivity, poor dietary choices and obesity can cause diabetes, heart attack, stroke, and kidney damage, among others.     ALCOHOL AND SMOKING:  Women should limit their alcohol intake to no  more than 7 drinks/beers/glasses of wine (combined, not each!) per week. Moderation of alcohol intake to this level decreases your risk of breast cancer and liver damage.  ( And of course, no recreational drugs are part of a healthy lifestyle.)  Also, you should not be smoking at all or even being exposed to second hand smoke. Most people know smoking can  cause cancer, and various heart and lung diseases, but did you know it also contributes to weakening of your bones?  Aging of your skin?  Yellowing of your teeth and nails?   CALCIUM AND VITAMIN D:  Adequate intake of calcium and Vitamin D are recommended.  The recommendations for exact amounts of these supplements seem to change often, but generally speaking 600 mg of calcium (either carbonate or citrate) and 800 units of Vitamin D per day seems prudent. Certain women may benefit from higher intake of Vitamin D.  If you are among these women, your doctor will have told you during your visit.     PAP SMEARS:  Pap smears, to check for cervical cancer or precancers,  have traditionally been done yearly, although recent scientific advances have shown that most women can have pap smears less often.  However, every woman still should have a physical exam from her gynecologist or primary care physician every year. It will include a breast check, inspection of the vulva and vagina to check for abnormal growths or skin changes, a visual exam of the cervix, and then an exam to evaluate the size and shape of the uterus and ovaries.  And after 23 years of age, a rectal exam is indicated to check for rectal cancers. We will also provide age appropriate advice regarding health maintenance, like when you should have certain vaccines, screening for sexually transmitted diseases, bone density testing, colonoscopy, mammograms, etc.    MAMMOGRAMS:  All women over 65 years old should have a yearly mammogram. Many facilities now offer a "3D" mammogram, which may cost  around $50 extra out of pocket. If possible,  we recommend you accept the option to have the 3D mammogram performed.  It both reduces the number of women who will be called back for extra views which then turn out to be normal, and it is better than the routine mammogram at detecting truly abnormal areas.     COLONOSCOPY:  Colonoscopy to screen for colon cancer is recommended for all women at age 26.  We know, you hate the idea of the prep.  We agree, BUT, having colon cancer and not knowing it is worse!!  Colon cancer so often starts as a polyp that can be seen and removed at colonscopy, which can quite literally save your life!  And if your first colonoscopy is normal and you have no family history of colon cancer, most women don't have to have it again for 10 years.  Once every ten years, you can do something that may end up saving your life, right?  We will be happy to help you get it scheduled when you are ready.  Be sure to check your insurance coverage so you understand how much it will cost.  It may be covered as a preventative service at no cost, but you should check your particular policy.    Mediterranean Diet A Mediterranean diet refers to food and lifestyle choices that are based on the traditions of countries located on the [employer]. This way of eating has been shown to help prevent certain conditions and improve outcomes for people who have chronic diseases, like kidney disease and heart disease. What are tips for following this plan? Lifestyle  Cook and eat meals together with your family, when possible.  Drink enough fluid to keep your urine clear or pale yellow.  Be physically active every day. This includes: ? Aerobic exercise like running or swimming. ? Leisure activities like  gardening, walking, or housework.  Get 7-8 hours of sleep each night.  If recommended by your health care provider, drink red wine in moderation. This means 1 glass a day for nonpregnant women  and 2 glasses a day for men. A glass of wine equals 5 oz (150 mL). Reading food labels  Check the serving size of packaged foods. For foods such as rice and pasta, the serving size refers to the amount of cooked product, not dry.  Check the total fat in packaged foods. Avoid foods that have saturated fat or trans fats.  Check the ingredients list for added sugars, such as corn syrup. Shopping  At the grocery store, buy most of your food from the areas near the walls of the store. This includes: ? Fresh fruits and vegetables (produce). ? Grains, beans, nuts, and seeds. Some of these may be available in unpackaged forms or large amounts (in bulk). ? Fresh seafood. ? Poultry and eggs. ? Low-fat dairy products.  Buy whole ingredients instead of prepackaged foods.  Buy fresh fruits and vegetables in-season from local farmers markets.  Buy frozen fruits and vegetables in resealable bags.  If you do not have access to quality fresh seafood, buy precooked frozen shrimp or canned fish, such as tuna, salmon, or sardines.  Buy small amounts of raw or cooked vegetables, salads, or olives from the deli or salad bar at your store.  Stock your pantry so you always have certain foods on hand, such as olive oil, canned tuna, canned tomatoes, rice, pasta, and beans. Cooking  Cook foods with extra-virgin olive oil instead of using butter or other vegetable oils.  Have meat as a side dish, and have vegetables or grains as your main dish. This means having meat in small portions or adding small amounts of meat to foods like pasta or stew.  Use beans or vegetables instead of meat in common dishes like chili or lasagna.  Experiment with different cooking methods. Try roasting or broiling vegetables instead of steaming or sauteing them.  Add frozen vegetables to soups, stews, pasta, or rice.  Add nuts or seeds for added healthy fat at each meal. You can add these to yogurt, salads, or vegetable  dishes.  Marinate fish or vegetables using olive oil, lemon juice, garlic, and fresh herbs. Meal planning  Plan to eat 1 vegetarian meal one day each week. Try to work up to 2 vegetarian meals, if possible.  Eat seafood 2 or more times a week.  Have healthy snacks readily available, such as: ? Vegetable sticks with hummus. ? [place] yogurt. ? Fruit and nut trail mix.  Eat balanced meals throughout the week. This includes: ? Fruit: 2-3 servings a day ? Vegetables: 4-5 servings a day ? Low-fat dairy: 2 servings a day ? Fish, poultry, or lean meat: 1 serving a day ? Beans and legumes: 2 or more servings a week ? Nuts and seeds: 1-2 servings a day ? Whole grains: 6-8 servings a day ? Extra-virgin olive oil: 3-4 servings a day  Limit red meat and sweets to only a few servings a month What are my food choices?  Mediterranean diet ? Recommended ? Grains: Whole-grain pasta. Brown rice. Bulgar wheat. Polenta. Couscous. Whole-wheat bread. [place]. ? Vegetables: Artichokes. Beets. Broccoli. Cabbage. Carrots. Eggplant. Green beans. Chard. Kale. Spinach. Onions. Leeks. Peas. Squash. Tomatoes. Peppers. Radishes. ? Fruits: Apples. Apricots. Avocado. Berries. Bananas. Cherries. Dates. Figs. Grapes. Lemons. Melon. Oranges. Peaches. Plums. Pomegranate. ? Meats and other protein  foods: Beans. Almonds. Sunflower seeds. Pine nuts. Peanuts. [place]. Salmon. Scallops. Shrimp. [place]. Tilapia. Clams. Oysters. Eggs. ? Dairy: Low-fat milk. Cheese. Greek yogurt. ? Beverages: Water. Red wine. Herbal tea. ? Fats and oils: Extra virgin olive oil. Avocado oil. Grape seed oil. ? Sweets and desserts: [place] yogurt with honey. Baked apples. Poached pears. Trail mix. ? Seasoning and other foods: Basil. Cilantro. Coriander. Cumin. Mint. Parsley. Sage. Rosemary. Tarragon. Garlic. Oregano. Thyme. Pepper. Balsalmic vinegar. Tahini. Hummus. Tomato sauce. Olives. Mushrooms. ? Limit these ? Grains: Prepackaged pasta or  rice dishes. Prepackaged cereal with added sugar. ? Vegetables: Deep fried potatoes (french fries). ? Fruits: Fruit canned in syrup. ? Meats and other protein foods: Beef. Pork. Lamb. Poultry with skin. Hot dogs. [place] Salines. ? Dairy: Ice cream. Sour cream. Whole milk. ? Beverages: Juice. Sugar-sweetened soft drinks. Beer. Liquor and spirits. ? Fats and oils: Butter. Canola oil. Vegetable oil. Beef fat (tallow). Lard. ? Sweets and desserts: Cookies. Cakes. Pies. Candy. ? Seasoning and other foods: Mayonnaise. Premade sauces and marinades. ? The items listed may not be a complete list. Talk with your dietitian about what dietary choices are right for you. Summary  The Mediterranean diet includes both food and lifestyle choices.  Eat a variety of fresh fruits and vegetables, beans, nuts, seeds, and whole grains.  Limit the amount of red meat and sweets that you eat.  Talk with your health care provider about whether it is safe for you to drink red wine in moderation. This means 1 glass a day for nonpregnant women and 2 glasses a day for men. A glass of wine equals 5 oz (150 mL). This information is not intended to replace advice given to you by your health care provider. Make sure you discuss any questions you have with your health care provider. Document Released: 07/19/2016 Document Revised: 08/21/2016 Document Reviewed: 07/19/2016 Elsevier Interactive Patient Education  2018 [employer].  Increase water intake, strive for at least 100 ounces/day.   Follow Mediterranean Diet. Increase regular exercise.  Recommend at least 30 minutes daily, 5 days per week of walking, jogging, biking, swimming, YouTube/Pinterest workout videos. Overall your labs are excellent. When you need refill on your birth control, please call pharmacy and they will reach out to [place]. Recommend annual physical with fasting labs. NICE TO SEE YOU!

## 2018-09-26 LAB — HIV ANTIBODY (ROUTINE TESTING W REFLEX): HIV Screen 4th Generation wRfx: NONREACTIVE

## 2019-09-28 ENCOUNTER — Encounter: Payer: Self-pay | Admitting: Adult Health

## 2019-09-28 ENCOUNTER — Ambulatory Visit (INDEPENDENT_AMBULATORY_CARE_PROVIDER_SITE_OTHER): Payer: PRIVATE HEALTH INSURANCE | Admitting: Adult Health

## 2019-09-28 ENCOUNTER — Other Ambulatory Visit: Payer: Self-pay

## 2019-09-28 VITALS — BP 109/72 | HR 71 | Temp 98.5°F | Ht 66.25 in | Wt 205.5 lb

## 2019-09-28 DIAGNOSIS — Z23 Encounter for immunization: Secondary | ICD-10-CM | POA: Diagnosis not present

## 2019-09-28 DIAGNOSIS — Z0000 Encounter for general adult medical examination without abnormal findings: Secondary | ICD-10-CM | POA: Diagnosis not present

## 2019-09-28 DIAGNOSIS — K5900 Constipation, unspecified: Secondary | ICD-10-CM | POA: Insufficient documentation

## 2019-09-28 NOTE — Patient Instructions (Addendum)
Preventive Care for Adults, Female  A healthy lifestyle and preventive care can promote health and wellness. Preventive health guidelines for women include the following key practices.   A routine yearly physical is a good way to check with your health care provider about your health and preventive screening. It is a chance to share any concerns and updates on your health and to receive a thorough exam.   Visit your dentist for a routine exam and preventive care every 6 months. Brush your teeth twice a day and floss once a day. Good oral hygiene prevents tooth decay and gum disease.   The frequency of eye exams is based on your age, health, family medical history, use of contact lenses, and other factors. Follow your health care provider's recommendations for frequency of eye exams.   Eat a healthy diet. Foods like vegetables, fruits, whole grains, low-fat dairy products, and lean protein foods contain the nutrients you need without too many calories. Decrease your intake of foods high in solid fats, added sugars, and salt. Eat the right amount of calories for you.Get information about a proper diet from your health care provider, if necessary.   Regular physical exercise is one of the most important things you can do for your health. Most adults should get at least 150 minutes of moderate-intensity exercise (any activity that increases your heart rate and causes you to sweat) each week. In addition, most adults need muscle-strengthening exercises on 2 or more days a week.   Maintain a healthy weight. The body mass index (BMI) is a screening tool to identify possible weight problems. It provides an estimate of body fat based on height and weight. Your health care provider can find your BMI, and can help you achieve or maintain a healthy weight.For adults 20 years and older:   - A BMI below 18.5 is considered underweight.   - A BMI of 18.5 to 24.9 is normal.   - A BMI of 25 to 29.9 is  considered overweight.   - A BMI of 30 and above is considered obese.   Maintain normal blood lipids and cholesterol levels by exercising and minimizing your intake of trans and saturated fats.  Eat a balanced diet with plenty of fruit and vegetables. Blood tests for lipids and cholesterol should begin at age 20 and be repeated every 5 years minimum.  If your lipid or cholesterol levels are high, you are over 40, or you are at high risk for heart disease, you may need your cholesterol levels checked more frequently.Ongoing high lipid and cholesterol levels should be treated with medicines if diet and exercise are not working.   If you smoke, find out from your health care provider how to quit. If you do not use tobacco, do not start.   Lung cancer screening is recommended for adults aged 55-80 years who are at high risk for developing lung cancer because of a history of smoking. A yearly low-dose CT scan of the lungs is recommended for people who have at least a 30-pack-year history of smoking and are a current smoker or have quit within the past 15 years. A pack year of smoking is smoking an average of 1 pack of cigarettes a day for 1 year (for example: 1 pack a day for 30 years or 2 packs a day for 15 years). Yearly screening should continue until the smoker has stopped smoking for at least 15 years. Yearly screening should be stopped for people who develop a   health problem that would prevent them from having lung cancer treatment.   If you are pregnant, do not drink alcohol. If you are breastfeeding, be very cautious about drinking alcohol. If you are not pregnant and choose to drink alcohol, do not have more than 1 drink per day. One drink is considered to be 12 ounces (355 mL) of beer, 5 ounces (148 mL) of wine, or 1.5 ounces (44 mL) of liquor.   Avoid use of street drugs. Do not share needles with anyone. Ask for help if you need support or instructions about stopping the use of  drugs.   High blood pressure causes heart disease and increases the risk of stroke. Your blood pressure should be checked at least yearly.  Ongoing high blood pressure should be treated with medicines if weight loss and exercise do not work.   If you are 69-55 years old, ask your health care provider if you should take aspirin to prevent strokes.   Diabetes screening involves taking a blood sample to check your fasting blood sugar level. This should be done once every 3 years, after age 38, if you are within normal weight and without risk factors for diabetes. Testing should be considered at a younger age or be carried out more frequently if you are overweight and have at least 1 risk factor for diabetes.   Breast cancer screening is essential preventive care for women. You should practice "breast self-awareness."  This means understanding the normal appearance and feel of your breasts and may include breast self-examination.  Any changes detected, no matter how small, should be reported to a health care provider.  Women in their 80s and 30s should have a clinical breast exam (CBE) by a health care provider as part of a regular health exam every 1 to 3 years.  After age 66, women should have a CBE every year.  Starting at age 1, women should consider having a mammogram (breast X-ray test) every year.  Women who have a family history of breast cancer should talk to their health care provider about genetic screening.  Women at a high risk of breast cancer should talk to their health care providers about having an MRI and a mammogram every year.   -Breast cancer gene (BRCA)-related cancer risk assessment is recommended for women who have family members with BRCA-related cancers. BRCA-related cancers include breast, ovarian, tubal, and peritoneal cancers. Having family members with these cancers may be associated with an increased risk for harmful changes (mutations) in the breast cancer genes BRCA1 and  BRCA2. Results of the assessment will determine the need for genetic counseling and BRCA1 and BRCA2 testing.   The Pap test is a screening test for cervical cancer. A Pap test can show cell changes on the cervix that might become cervical cancer if left untreated. A Pap test is a procedure in which cells are obtained and examined from the lower end of the uterus (cervix).   - Women should have a Pap test starting at age 57.   - Between ages 90 and 70, Pap tests should be repeated every 2 years.   - Beginning at age 63, you should have a Pap test every 3 years as long as the past 3 Pap tests have been normal.   - Some women have medical problems that increase the chance of getting cervical cancer. Talk to your health care provider about these problems. It is especially important to talk to your health care provider if a  new problem develops soon after your last Pap test. In these cases, your health care provider may recommend more frequent screening and Pap tests.   - The above recommendations are the same for women who have or have not gotten the vaccine for human papillomavirus (HPV).   - If you had a hysterectomy for a problem that was not cancer or a condition that could lead to cancer, then you no longer need Pap tests. Even if you no longer need a Pap test, a regular exam is a good idea to make sure no other problems are starting.   - If you are between ages 36 and 66 years, and you have had normal Pap tests going back 10 years, you no longer need Pap tests. Even if you no longer need a Pap test, a regular exam is a good idea to make sure no other problems are starting.   - If you have had past treatment for cervical cancer or a condition that could lead to cancer, you need Pap tests and screening for cancer for at least 20 years after your treatment.   - If Pap tests have been discontinued, risk factors (such as a new sexual partner) need to be reassessed to determine if screening should  be resumed.   - The HPV test is an additional test that may be used for cervical cancer screening. The HPV test looks for the virus that can cause the cell changes on the cervix. The cells collected during the Pap test can be tested for HPV. The HPV test could be used to screen women aged 70 years and older, and should be used in women of any age who have unclear Pap test results. After the age of 67, women should have HPV testing at the same frequency as a Pap test.   Colorectal cancer can be detected and often prevented. Most routine colorectal cancer screening begins at the age of 57 years and continues through age 26 years. However, your health care provider may recommend screening at an earlier age if you have risk factors for colon cancer. On a yearly basis, your health care provider may provide home test kits to check for hidden blood in the stool.  Use of a small camera at the end of a tube, to directly examine the colon (sigmoidoscopy or colonoscopy), can detect the earliest forms of colorectal cancer. Talk to your health care provider about this at age 23, when routine screening begins. Direct exam of the colon should be repeated every 5 -10 years through age 49 years, unless early forms of pre-cancerous polyps or small growths are found.   People who are at an increased risk for hepatitis B should be screened for this virus. You are considered at high risk for hepatitis B if:  -You were born in a country where hepatitis B occurs often. Talk with your health care provider about which countries are considered high risk.  - Your parents were born in a high-risk country and you have not received a shot to protect against hepatitis B (hepatitis B vaccine).  - You have HIV or AIDS.  - You use needles to inject street drugs.  - You live with, or have sex with, someone who has Hepatitis B.  - You get hemodialysis treatment.  - You take certain medicines for conditions like cancer, organ  transplantation, and autoimmune conditions.   Hepatitis C blood testing is recommended for all people born from [birth year] through 1965 and any individual  with known risks for hepatitis C.   Practice safe sex. Use condoms and avoid high-risk sexual practices to reduce the spread of sexually transmitted infections (STIs). STIs include gonorrhea, chlamydia, syphilis, trichomonas, herpes, HPV, and human immunodeficiency virus (HIV). Herpes, HIV, and HPV are viral illnesses that have no cure. They can result in disability, cancer, and death. Sexually active women aged 25 years and younger should be checked for chlamydia. Older women with new or multiple partners should also be tested for chlamydia. Testing for other STIs is recommended if you are sexually active and at increased risk.   Osteoporosis is a disease in which the bones lose minerals and strength with aging. This can result in serious bone fractures or breaks. The risk of osteoporosis can be identified using a bone density scan. Women ages 65 years and over and women at risk for fractures or osteoporosis should discuss screening with their health care providers. Ask your health care provider whether you should take a calcium supplement or vitamin D to There are also several preventive steps women can take to avoid osteoporosis and resulting fractures or to keep osteoporosis from worsening. -->Recommendations include:  Eat a balanced diet high in fruits, vegetables, calcium, and vitamins.  Get enough calcium. The recommended total intake of is 1,200 mg daily; for best absorption, if taking supplements, divide doses into 250-500 mg doses throughout the day. Of the two types of calcium, calcium carbonate is best absorbed when taken with food but calcium citrate can be taken on an empty stomach.  Get enough vitamin D. NAMS and the National Osteoporosis Foundation recommend at least 1,000 IU per day for women age 50 and over who are at risk of vitamin D  deficiency. Vitamin D deficiency can be caused by inadequate sun exposure (for example, those who live in northern latitudes).  Avoid alcohol and smoking. Heavy alcohol intake (more than 7 drinks per week) increases the risk of falls and hip fracture and women smokers tend to lose bone more rapidly and have lower bone mass than nonsmokers. Stopping smoking is one of the most important changes women can make to improve their health and decrease risk for disease.  Be physically active every day. Weight-bearing exercise (for example, fast walking, hiking, jogging, and weight training) may strengthen bones or slow the rate of bone loss that comes with aging. Balancing and muscle-strengthening exercises can reduce the risk of falling and fracture.  Consider therapeutic medications. Currently, several types of effective drugs are available. Healthcare providers can recommend the type most appropriate for each woman.  Eliminate environmental factors that may contribute to accidents. Falls cause nearly 90% of all osteoporotic fractures, so reducing this risk is an important bone-health strategy. Measures include ample lighting, removing obstructions to walking, using nonskid rugs on floors, and placing mats and/or grab bars in showers.  Be aware of medication side effects. Some common medicines make bones weaker. These include a type of steroid drug called glucocorticoids used for arthritis and asthma, some antiseizure drugs, certain sleeping pills, treatments for endometriosis, and some cancer drugs. An overactive thyroid gland or using too much thyroid hormone for an underactive thyroid can also be a problem. If you are taking these medicines, talk to your doctor about what you can do to help protect your bones.reduce the rate of osteoporosis.    Menopause can be associated with physical symptoms and risks. Hormone replacement therapy is available to decrease symptoms and risks. You should talk to your  health care provider   about whether hormone replacement therapy is right for you.   Use sunscreen. Apply sunscreen liberally and repeatedly throughout the day. You should seek shade when your shadow is shorter than you. Protect yourself by wearing long sleeves, pants, a wide-brimmed hat, and sunglasses year round, whenever you are outdoors.   Once a month, do a whole body skin exam, using a mirror to look at the skin on your back. Tell your health care provider of new moles, moles that have irregular borders, moles that are larger than a pencil eraser, or moles that have changed in shape or color.   -Stay current with required vaccines (immunizations).   Influenza vaccine. All adults should be immunized every year.  Tetanus, diphtheria, and acellular pertussis (Td, Tdap) vaccine. Pregnant women should receive 1 dose of Tdap vaccine during each pregnancy. The dose should be obtained regardless of the length of time since the last dose. Immunization is preferred during the 27th 36th week of gestation. An adult who has not previously received Tdap or who does not know her vaccine status should receive 1 dose of Tdap. This initial dose should be followed by tetanus and diphtheria toxoids (Td) booster doses every 10 years. Adults with an unknown or incomplete history of completing a 3-dose immunization series with Td-containing vaccines should begin or complete a primary immunization series including a Tdap dose. Adults should receive a Td booster every 10 years.  Varicella vaccine. An adult without evidence of immunity to varicella should receive 2 doses or a second dose if she has previously received 1 dose. Pregnant females who do not have evidence of immunity should receive the first dose after pregnancy. This first dose should be obtained before leaving the health care facility. The second dose should be obtained 4 8 weeks after the first dose.  Human papillomavirus (HPV) vaccine. Females aged 13 26  years who have not received the vaccine previously should obtain the 3-dose series. The vaccine is not recommended for use in pregnant females. However, pregnancy testing is not needed before receiving a dose. If a female is found to be pregnant after receiving a dose, no treatment is needed. In that case, the remaining doses should be delayed until after the pregnancy. Immunization is recommended for any person with an immunocompromised condition through the age of 26 years if she did not get any or all doses earlier. During the 3-dose series, the second dose should be obtained 4 8 weeks after the first dose. The third dose should be obtained 24 weeks after the first dose and 16 weeks after the second dose.  Zoster vaccine. One dose is recommended for adults aged 60 years or older unless certain conditions are present.  Measles, mumps, and rubella (MMR) vaccine. Adults born before 1957 generally are considered immune to measles and mumps. Adults born in 1957 or later should have 1 or more doses of MMR vaccine unless there is a contraindication to the vaccine or there is laboratory evidence of immunity to each of the three diseases. A routine second dose of MMR vaccine should be obtained at least 28 days after the first dose for students attending postsecondary schools, health care workers, or international travelers. People who received inactivated measles vaccine or an unknown type of measles vaccine during 1963 1967 should receive 2 doses of MMR vaccine. People who received inactivated mumps vaccine or an unknown type of mumps vaccine before 1979 and are at high risk for mumps infection should consider immunization with 2 doses of   MMR vaccine. For females of childbearing age, rubella immunity should be determined. If there is no evidence of immunity, females who are not pregnant should be vaccinated. If there is no evidence of immunity, females who are pregnant should delay immunization until after pregnancy.  Unvaccinated health care workers born before [birth year] who lack laboratory evidence of measles, mumps, or rubella immunity or laboratory confirmation of disease should consider measles and mumps immunization with 2 doses of MMR vaccine or rubella immunization with 1 dose of MMR vaccine.  Pneumococcal 13-valent conjugate (PCV13) vaccine. When indicated, a person who is uncertain of her immunization history and has no record of immunization should receive the PCV13 vaccine. An adult aged 54 years or older who has certain medical conditions and has not been previously immunized should receive 1 dose of PCV13 vaccine. This PCV13 should be followed with a dose of pneumococcal polysaccharide (PPSV23) vaccine. The PPSV23 vaccine dose should be obtained at least 8 weeks after the dose of PCV13 vaccine. An adult aged 58 years or older who has certain medical conditions and previously received 1 or more doses of PPSV23 vaccine should receive 1 dose of PCV13. The PCV13 vaccine dose should be obtained 1 or more years after the last PPSV23 vaccine dose.  Pneumococcal polysaccharide (PPSV23) vaccine. When PCV13 is also indicated, PCV13 should be obtained first. All adults aged 58 years and older should be immunized. An adult younger than age 65 years who has certain medical conditions should be immunized. Any person who resides in a nursing home or long-term care facility should be immunized. An adult smoker should be immunized. People with an immunocompromised condition and certain other conditions should receive both PCV13 and PPSV23 vaccines. People with human immunodeficiency virus (HIV) infection should be immunized as soon as possible after diagnosis. Immunization during chemotherapy or radiation therapy should be avoided. Routine use of PPSV23 vaccine is not recommended for American Indians, [place] Natives, or people younger than 65 years unless there are medical conditions that require PPSV23 vaccine. When indicated,  people who have unknown immunization and have no record of immunization should receive PPSV23 vaccine. One-time revaccination 5 years after the first dose of PPSV23 is recommended for people aged 70 64 years who have chronic kidney failure, nephrotic syndrome, asplenia, or immunocompromised conditions. People who received 1 2 doses of PPSV23 before age 32 years should receive another dose of PPSV23 vaccine at age 96 years or later if at least 5 years have passed since the previous dose. Doses of PPSV23 are not needed for people immunized with PPSV23 at or after age 55 years.  Meningococcal vaccine. Adults with asplenia or persistent complement component deficiencies should receive 2 doses of quadrivalent meningococcal conjugate (MenACWY-D) vaccine. The doses should be obtained at least 2 months apart. Microbiologists working with certain meningococcal bacteria, [employer] recruits, people at risk during an outbreak, and people who travel to or live in countries with a high rate of meningitis should be immunized. A first-year college student up through age 58 years who is living in a residence hall should receive a dose if she did not receive a dose on or after her 16th birthday. Adults who have certain high-risk conditions should receive one or more doses of vaccine.  Hepatitis A vaccine. Adults who wish to be protected from this disease, have certain high-risk conditions, work with hepatitis A-infected animals, work in hepatitis A research labs, or travel to or work in countries with a high rate of hepatitis A should be  immunized. Adults who were previously unvaccinated and who anticipate close contact with an international adoptee during the first 60 days after arrival in the [place] from a country with a high rate of hepatitis A should be immunized.  Hepatitis B vaccine.  Adults who wish to be protected from this disease, have certain high-risk conditions, may be exposed to blood or other infectious  body fluids, are household contacts or sex partners of hepatitis B positive people, are clients or workers in certain care facilities, or travel to or work in countries with a high rate of hepatitis B should be immunized.  Haemophilus influenzae type b (Hib) vaccine. A previously unvaccinated person with asplenia or sickle cell disease or having a scheduled splenectomy should receive 1 dose of Hib vaccine. Regardless of previous immunization, a recipient of a hematopoietic stem cell transplant should receive a 3-dose series 6 12 months after her successful transplant. Hib vaccine is not recommended for adults with HIV infection.  Preventive Services / Frequency Ages 6 to 39years  Blood pressure check.** / Every 1 to 2 years.  Lipid and cholesterol check.** / Every 5 years beginning at age 39.  Clinical breast exam.** / Every 3 years for women in their 61s and 62s.  BRCA-related cancer risk assessment.** / For women who have family members with a BRCA-related cancer (breast, ovarian, tubal, or peritoneal cancers).  Pap test.** / Every 2 years from ages 47 through 85. Every 3 years starting at age 34 through age 12 or 74 with a history of 3 consecutive normal Pap tests.  HPV screening.** / Every 3 years from ages 46 through ages 43 to 54 with a history of 3 consecutive normal Pap tests.  Hepatitis C blood test.** / For any individual with known risks for hepatitis C.  Skin self-exam. / Monthly.  Influenza vaccine. / Every year.  Tetanus, diphtheria, and acellular pertussis (Tdap, Td) vaccine.** / Consult your health care provider. Pregnant women should receive 1 dose of Tdap vaccine during each pregnancy. 1 dose of Td every 10 years.  Varicella vaccine.** / Consult your health care provider. Pregnant females who do not have evidence of immunity should receive the first dose after pregnancy.  HPV vaccine. / 3 doses over 6 months, if 64 and younger. The vaccine is not recommended for use in  pregnant females. However, pregnancy testing is not needed before receiving a dose.  Measles, mumps, rubella (MMR) vaccine.** / You need at least 1 dose of MMR if you were born in 1957 or later. You may also need a 2nd dose. For females of childbearing age, rubella immunity should be determined. If there is no evidence of immunity, females who are not pregnant should be vaccinated. If there is no evidence of immunity, females who are pregnant should delay immunization until after pregnancy.  Pneumococcal 13-valent conjugate (PCV13) vaccine.** / Consult your health care provider.  Pneumococcal polysaccharide (PPSV23) vaccine.** / 1 to 2 doses if you smoke cigarettes or if you have certain conditions.  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 You may also need additional booster doses.  Hepatitis A vaccine.** / Consult your health care provider.  Hepatitis B vaccine.** / Consult your health care provider.  Haemophilus influenzae type b (Hib) vaccine.** / Consult your health care provider.  Ages 55 to 64years  Blood pressure check.** / Every 1 to 2 years.  Lipid and cholesterol check.** / Every 5 years beginning at age 20 years.  Lung cancer screening. / Every year if you are aged 55 80 years and have a 30-pack-year history of smoking and currently smoke or have quit within the past 15 years. Yearly screening is stopped once you have quit smoking for at least 15 years or develop a health problem that would prevent you from having lung cancer treatment.  Clinical breast exam.** / Every year after age 40 years.  BRCA-related cancer risk assessment.** / For women who have family members with a BRCA-related cancer (breast, ovarian, tubal, or peritoneal cancers).  Mammogram.** / Every year beginning at age 40  years and continuing for as long as you are in good health. Consult with your health care provider.  Pap test.** / Every 3 years starting at age 30 years through age 65 or 70 years with a history of 3 consecutive normal Pap tests.  HPV screening.** / Every 3 years from ages 30 years through ages 65 to 70 years with a history of 3 consecutive normal Pap tests.  Fecal occult blood test (FOBT) of stool. / Every year beginning at age 50 years and continuing until age 75 years. You may not need to do this test if you get a colonoscopy every 10 years.  Flexible sigmoidoscopy or colonoscopy.** / Every 5 years for a flexible sigmoidoscopy or every 10 years for a colonoscopy beginning at age 50 years and continuing until age 75 years.  Hepatitis C blood test.** / For all people born from 1945 through 1965 and any individual with known risks for hepatitis C.  Skin self-exam. / Monthly.  Influenza vaccine. / Every year.  Tetanus, diphtheria, and acellular pertussis (Tdap/Td) vaccine.** / Consult your health care provider. Pregnant women should receive 1 dose of Tdap vaccine during each pregnancy. 1 dose of Td every 10 years.  Varicella vaccine.** / Consult your health care provider. Pregnant females who do not have evidence of immunity should receive the first dose after pregnancy.  Zoster vaccine.** / 1 dose for adults aged 60 years or older.  Measles, mumps, rubella (MMR) vaccine.** / You need at least 1 dose of MMR if you were born in 1957 or later. You may also need a 2nd dose. For females of childbearing age, rubella immunity should be determined. If there is no evidence of immunity, females who are not pregnant should be vaccinated. If there is no evidence of immunity, females who are pregnant should delay immunization until after pregnancy.  Pneumococcal 13-valent conjugate (PCV13) vaccine.** / Consult your health care provider.  Pneumococcal polysaccharide (PPSV23) vaccine.** / 1 to 2 doses if  you smoke cigarettes or if you have certain conditions.  Meningococcal vaccine.** / Consult your health care provider.  Hepatitis A vaccine.** / Consult your health care provider.  Hepatitis B vaccine.** / Consult your health care provider.  Haemophilus influenzae type b (Hib) vaccine.** / Consult your health care provider.  Ages 65 years and over  Blood pressure check.** / Every 1 to 2 years.  Lipid and cholesterol check.** / Every 5 years beginning at age 20 years.  Lung cancer screening. / Every year if you are aged 55 80 years and have a 30-pack-year history of smoking and currently smoke or have quit within the past 15 years. Yearly screening is stopped once you have quit smoking for at least 15 years or develop a health problem that   would prevent you from having lung cancer treatment.  Clinical breast exam.** / Every year after age 103 years.  BRCA-related cancer risk assessment.** / For women who have family members with a BRCA-related cancer (breast, ovarian, tubal, or peritoneal cancers).  Mammogram.** / Every year beginning at age 36 years and continuing for as long as you are in good health. Consult with your health care provider.  Pap test.** / Every 3 years starting at age 5 years through age 85 or 10 years with 3 consecutive normal Pap tests. Testing can be stopped between 65 and 70 years with 3 consecutive normal Pap tests and no abnormal Pap or HPV tests in the past 10 years.  HPV screening.** / Every 3 years from ages 93 years through ages 70 or 45 years with a history of 3 consecutive normal Pap tests. Testing can be stopped between 65 and 70 years with 3 consecutive normal Pap tests and no abnormal Pap or HPV tests in the past 10 years.  Fecal occult blood test (FOBT) of stool. / Every year beginning at age 8 years and continuing until age 45 years. You may not need to do this test if you get a colonoscopy every 10 years.  Flexible sigmoidoscopy or colonoscopy.** /  Every 5 years for a flexible sigmoidoscopy or every 10 years for a colonoscopy beginning at age 69 years and continuing until age 68 years.  Hepatitis C blood test.** / For all people born from [birth year] through 1966 and any individual with known risks for hepatitis C.  Osteoporosis screening.** / A one-time screening for women ages 7 years and over and women at risk for fractures or osteoporosis.  Skin self-exam. / Monthly.  Influenza vaccine. / Every year.  Tetanus, diphtheria, and acellular pertussis (Tdap/Td) vaccine.** / 1 dose of Td every 10 years.  Varicella vaccine.** / Consult your health care provider.  Zoster vaccine.** / 1 dose for adults aged 5 years or older.  Pneumococcal 13-valent conjugate (PCV13) vaccine.** / Consult your health care provider.  Pneumococcal polysaccharide (PPSV23) vaccine.** / 1 dose for all adults aged 74 years and older.  Meningococcal vaccine.** / Consult your health care provider.  Hepatitis A vaccine.** / Consult your health care provider.  Hepatitis B vaccine.** / Consult your health care provider.  Haemophilus influenzae type b (Hib) vaccine.** / Consult your health care provider. ** Family history and personal history of risk and conditions may change your health care provider's recommendations. Document Released: 01/22/2002 Document Revised: 09/16/2013  [facility] Patient Information 2014 [place], [place].   EXERCISE AND DIET:  We recommended that you start or continue a regular exercise program for good health. Regular exercise means any activity that makes your heart beat faster and makes you sweat.  We recommend exercising at least 30 minutes per day at least 3 days a week, preferably 5.  We also recommend a diet low in fat and sugar / carbohydrates.  Inactivity, poor dietary choices and obesity can cause diabetes, heart attack, stroke, and kidney damage, among others.     ALCOHOL AND SMOKING:  Women should limit their alcohol intake to no  more than 7 drinks/beers/glasses of wine (combined, not each!) per week. Moderation of alcohol intake to this level decreases your risk of breast cancer and liver damage.  ( And of course, no recreational drugs are part of a healthy lifestyle.)  Also, you should not be smoking at all or even being exposed to second hand smoke. Most people know smoking can  cause cancer, and various heart and lung diseases, but did you know it also contributes to weakening of your bones?  Aging of your skin?  Yellowing of your teeth and nails?   CALCIUM AND VITAMIN D:  Adequate intake of calcium and Vitamin D are recommended.  The recommendations for exact amounts of these supplements seem to change often, but generally speaking 600 mg of calcium (either carbonate or citrate) and 800 units of Vitamin D per day seems prudent. Certain women may benefit from higher intake of Vitamin D.  If you are among these women, your doctor will have told you during your visit.     PAP SMEARS:  Pap smears, to check for cervical cancer or precancers,  have traditionally been done yearly, although recent scientific advances have shown that most women can have pap smears less often.  However, every woman still should have a physical exam from her gynecologist or primary care physician every year. It will include a breast check, inspection of the vulva and vagina to check for abnormal growths or skin changes, a visual exam of the cervix, and then an exam to evaluate the size and shape of the uterus and ovaries.  And after 24 years of age, a rectal exam is indicated to check for rectal cancers. We will also provide age appropriate advice regarding health maintenance, like when you should have certain vaccines, screening for sexually transmitted diseases, bone density testing, colonoscopy, mammograms, etc.    MAMMOGRAMS:  All women over 17 years old should have a yearly mammogram. Many facilities now offer a "3D" mammogram, which may cost  around $50 extra out of pocket. If possible,  we recommend you accept the option to have the 3D mammogram performed.  It both reduces the number of women who will be called back for extra views which then turn out to be normal, and it is better than the routine mammogram at detecting truly abnormal areas.     COLONOSCOPY:  Colonoscopy to screen for colon cancer is recommended for all women at age 44.  We know, you hate the idea of the prep.  We agree, BUT, having colon cancer and not knowing it is worse!!  Colon cancer so often starts as a polyp that can be seen and removed at colonscopy, which can quite literally save your life!  And if your first colonoscopy is normal and you have no family history of colon cancer, most women don't have to have it again for 10 years.  Once every ten years, you can do something that may end up saving your life, right?  We will be happy to help you get it scheduled when you are ready.  Be sure to check your insurance coverage so you understand how much it will cost.  It may be covered as a preventative service at no cost, but you should check your particular policy.   Increase water intake, strive for at least 100 ounces/day.   Follow Heart Healthy diet Increase regular exercise.  Recommend at least 30 minutes daily, 5 days per week of walking, biking, swimming, YouTube/Pinterest workout videos. Next year schedule fasting labs the week prior to annual physical. Continue to social distance and wear a mask when in public. GREAT TO SEE YOU!

## 2019-09-28 NOTE — Progress Notes (Signed)
Subjective:    Patient ID: Faith Bush, female    DOB: 1996/07/22, 23 y.o.   MRN: [id number]  HPI:05/27/18 OV:   Ms. Faith Bush is here to establish as a new pt.  She is a pleasant 23 year old female.   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 She denies lifting heavy objects or acute trauma prior to increase in sx's. Sx's include- intermittent pain with palpation and "a spot that turned black" a few weeks ago. She denies fever/poor appetite/change in bowel or bladder habits She denies tobacco/ETOH use She denies formal exercise She works PT at [employer] as Conservation officer, naturecashier and will start graduate school at [employer] next month  09/25/18 OV: Ms. Faith Bush is here for CPE 07/28/18 Successful excision of umbilical sebaceous cyst. She denies acute complaints She estimates to drink >70 oz water/day and has been trying to improve portion control of meals, she lost 7 lbs since last OV in June 2019 She denies formal exercise, however walks a lot daily for school/work She denies tobacco/vape use She seldom drinks EOTH She denies personal/family hx of migraines  Healthcare Maintenance: PAP-UTD, due 2020 Immunizations-UTD  09/28/2019 OV: Ms. Faith Bush is here for CPE. She is not on oral birth control- using condoms consistently. She reports having her PAPs completed at health department- will update this fall. She has not been walking/hiking and has gained >8 lbs since last OV 09/2018. She has graduated from her Masters program! She is working two PT jobs. She is in a committed relationship. She denies tobacco/vape/excessive ETOH use She reports issues with constipation- discussed proper hydration, increasing fiber intake, and increasing regular exercise to help improve bowel health.  Healthcare Maintenance: PAP-07/2016-normal, due now- she reports that she will update with health department this fall  Immunizations-Influenza vaccination  updated today  Patient Care Team    Relationship Specialty Notifications Start End  Julaine Fusianford, Shivangi Lutz D, NP PCP - General Family Medicine  05/06/18   [facility]    09/25/18     Patient Active Problem List   Diagnosis Date Noted  . Constipation 09/28/2019  . Healthcare maintenance 05/27/2018  . Eczema 08/12/2017     Past Medical History:  Diagnosis Date  . Eczema   . Medical history non-contributory      Past Surgical History:  Procedure Laterality Date  . EXCISION OF KELOID N/A 07/28/2018   Procedure: EXCISION OF UMBILICAL MASS / CYST;  Surgeon: Axel Filleramirez, Armando, MD;  [facility];  Service: General;  Laterality: N/A;  . WISDOM TOOTH EXTRACTION       History reviewed. No pertinent family history.   Social History   Substance and Sexual Activity  Drug Use Never     Social History   Substance and Sexual Activity  Alcohol Use Yes   Comment: 3 drinks/week     Social History   Tobacco Use  Smoking Status Never Smoker  Smokeless Tobacco Never Used     Outpatient Encounter Medications as of 09/28/2019  Medication Sig  . ibuprofen (ADVIL,MOTRIN) 200 MG tablet Take 400 mg by mouth every 8 (eight) hours as needed (for pain.).  Marland Kitchen. Multiple Vitamin (MULTIVITAMIN WITH MINERALS) TABS tablet Take 1 tablet by mouth daily.  . [DISCONTINUED] Norgestimate-Ethinyl Estradiol Triphasic (ORTHO TRI-CYCLEN, 28,) 0.18/0.215/0.25 MG-35 MCG tablet Take 1 tablet by mouth daily.   No facility-administered encounter medications on file as of 09/28/2019.     Allergies: Patient has no  known allergies.  Body mass index is 32.92 kg/m.  Blood pressure 109/72, pulse 71, temperature 98.5 F (36.9 C), temperature source Oral, height 5' 6.25" (1.683 m), weight 205 lb 8 oz (93.2 kg), last menstrual period 09/18/2019, SpO2 99 %.  Review of Systems  Constitutional: Positive for fatigue. Negative for activity change, appetite change, chills, diaphoresis, fever and  unexpected weight change.  HENT: Negative for congestion.   Eyes: Negative for visual disturbance.  Respiratory: Negative for cough, chest tightness, shortness of breath, wheezing and stridor.   Cardiovascular: Negative for chest pain, palpitations and leg swelling.  Gastrointestinal: Positive for constipation. Negative for abdominal distention, abdominal pain, anal bleeding, blood in stool, diarrhea, nausea and vomiting.  Endocrine: Negative for cold intolerance, heat intolerance, polydipsia, polyphagia and polyuria.  Genitourinary: Negative for difficulty urinating, flank pain, hematuria and menstrual problem.  Musculoskeletal: Negative for arthralgias, back pain, gait problem, joint swelling, myalgias, neck pain and neck stiffness.  Neurological: Negative for dizziness and headaches.  Hematological: Negative for adenopathy. Does not bruise/bleed easily.  Psychiatric/Behavioral: Negative for agitation, behavioral problems, confusion, decreased concentration, dysphoric mood, hallucinations, self-injury, sleep disturbance and suicidal ideas. The patient is not nervous/anxious and is not hyperactive.        Objective:   Physical Exam Vitals signs and nursing note reviewed.  Constitutional:      General: She is not in acute distress.    Appearance: Normal appearance. She is obese. She is not ill-appearing, toxic-appearing or diaphoretic.  HENT:     Head: Normocephalic and atraumatic.     Right Ear: Tympanic membrane, ear canal and external ear normal.     Left Ear: Tympanic membrane, ear canal and external ear normal.     Nose: Nose normal. No congestion.     Mouth/Throat:     Mouth: Mucous membranes are moist.     Pharynx: No oropharyngeal exudate.  Eyes:     Extraocular Movements: Extraocular movements intact.     Conjunctiva/sclera: Conjunctivae normal.     Pupils: Pupils are equal, round, and reactive to light.  Neck:     Musculoskeletal: Normal range of motion and neck supple.   Cardiovascular:     Rate and Rhythm: Normal rate and regular rhythm.     Pulses: Normal pulses.     Heart sounds: Normal heart sounds. No murmur. No friction rub. No gallop.   Pulmonary:     Effort: Pulmonary effort is normal. No respiratory distress.     Breath sounds: Normal breath sounds. No stridor. No wheezing, rhonchi or rales.  Chest:     Chest wall: No tenderness.  Abdominal:     General: Abdomen is protuberant. Bowel sounds are normal.     Palpations: Abdomen is soft.     Tenderness: There is no abdominal tenderness. There is no right CVA tenderness, left CVA tenderness, guarding or rebound.  Musculoskeletal:        General: No tenderness.     Right lower leg: No edema.     Left lower leg: No edema.  Skin:    General: Skin is warm.     Findings: No erythema.  Neurological:     Mental Status: She is alert and oriented to person, place, and time.     Coordination: Coordination normal.  Psychiatric:        Mood and Affect: Mood normal.        Behavior: Behavior normal.        Thought Content: Thought content normal.  Judgment: Judgment normal.       Assessment & Plan:   1. Need for influenza vaccination   2. Healthcare maintenance   3. Constipation, unspecified constipation type     Healthcare maintenance Increase water intake, strive for at least 100 ounces/day.   Follow Heart Healthy diet Increase regular exercise.  Recommend at least 30 minutes daily, 5 days per week of walking, biking, swimming, YouTube/Pinterest workout videos. Next year schedule fasting labs the week prior to annual physical. Continue to social distance and wear a mask when in public.  Constipation She reports issues with constipation- discussed proper hydration, increasing fiber intake, and increasing regular exercise to help improve bowel health.    FOLLOW-UP:  Return in about 1 year (around 09/27/2020) for CPE, Fasting Labs.

## 2019-09-28 NOTE — Assessment & Plan Note (Signed)
Increase water intake, strive for at least 100 ounces/day.   Follow Heart Healthy diet Increase regular exercise.  Recommend at least 30 minutes daily, 5 days per week of walking, biking, swimming, YouTube/Pinterest workout videos. Next year schedule fasting labs the week prior to annual physical. Continue to social distance and wear a mask when in public.

## 2019-09-28 NOTE — Assessment & Plan Note (Signed)
She reports issues with constipation- discussed proper hydration, increasing fiber intake, and increasing regular exercise to help improve bowel health.

## 2019-09-30 IMAGING — US US ABDOMEN LIMITED
1 series · 14 of 15 positions shown · non-contrast
Comparison: None.

CLINICAL DATA: The patient reports a chronic umbilical hernia which
has worsened over the past month with a change in coloration.

EXAM:
ULTRASOUND ABDOMEN LIMITED

[Series 1: us abdomen limited · 0.06mm/px · 15 acquisitions, 14 frames shown]
[im 1/15]
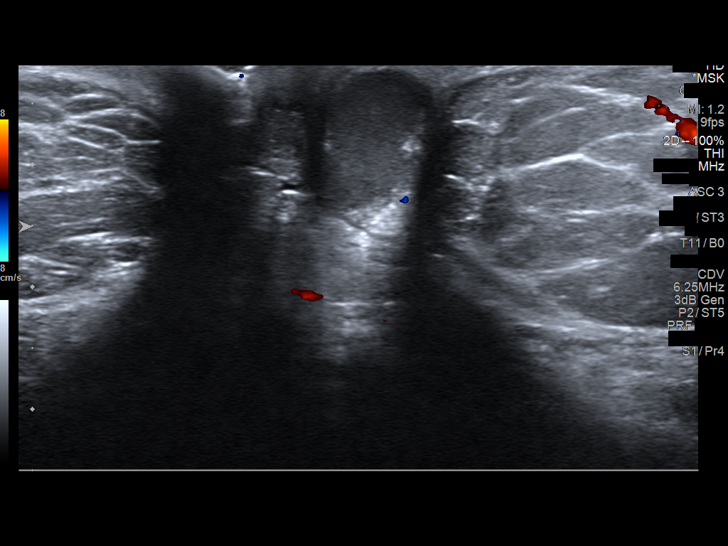
[im 2/15]
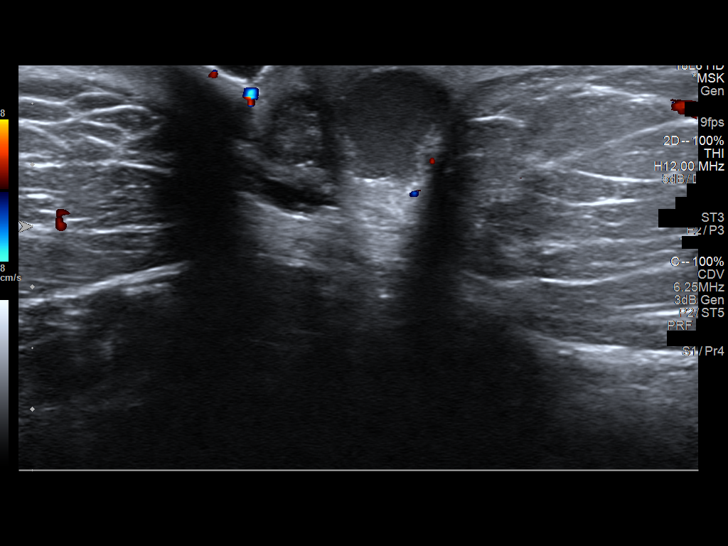
[im 3/15]
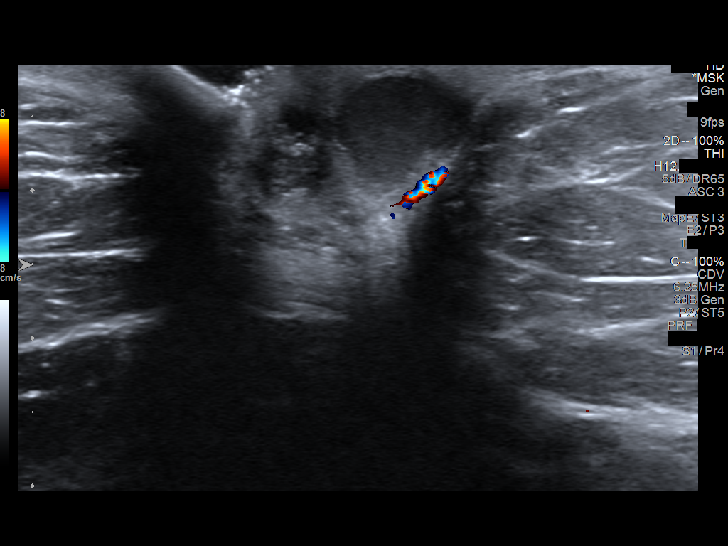
[im 4/15]
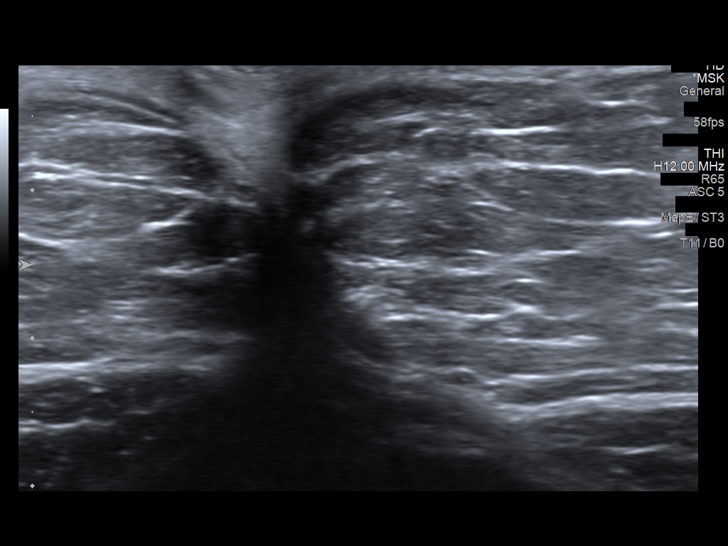
[im 5/15]
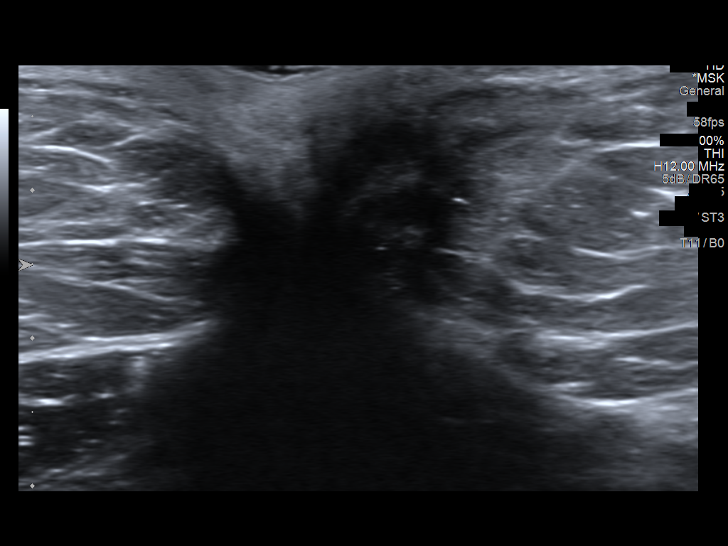
[im 6/15]
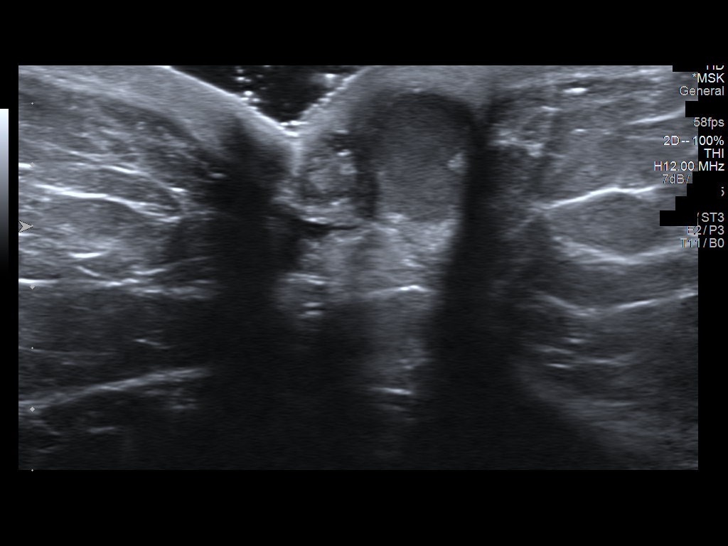
[im 7/15]
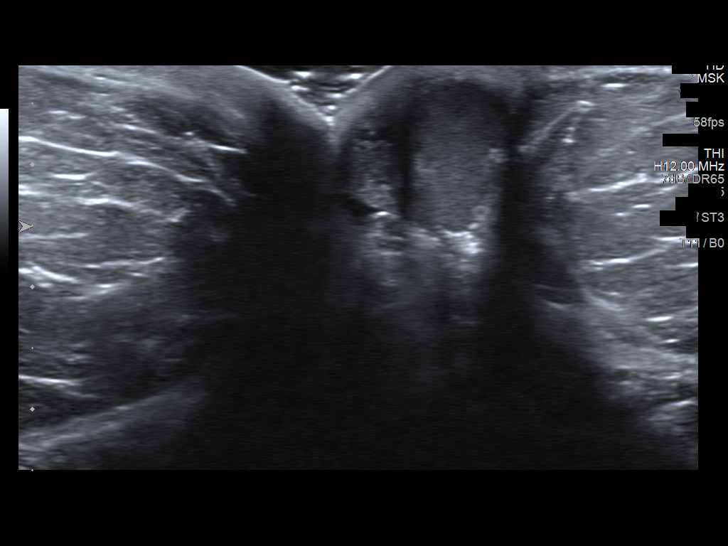
[im 9/15]
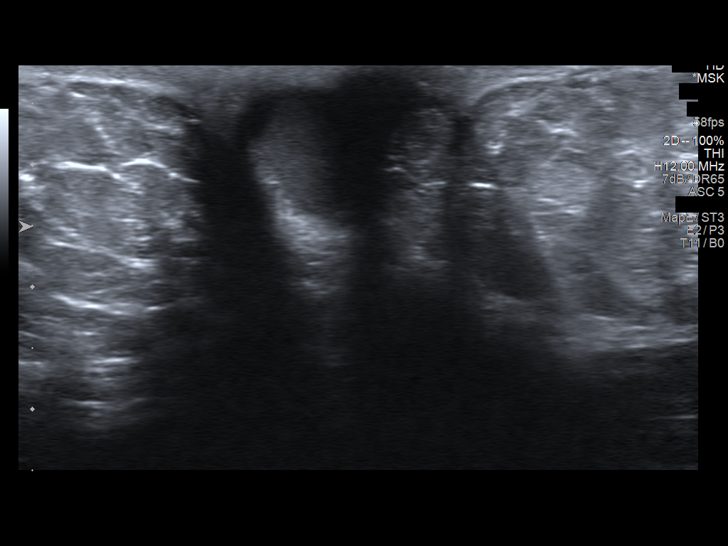
[im 10/15]
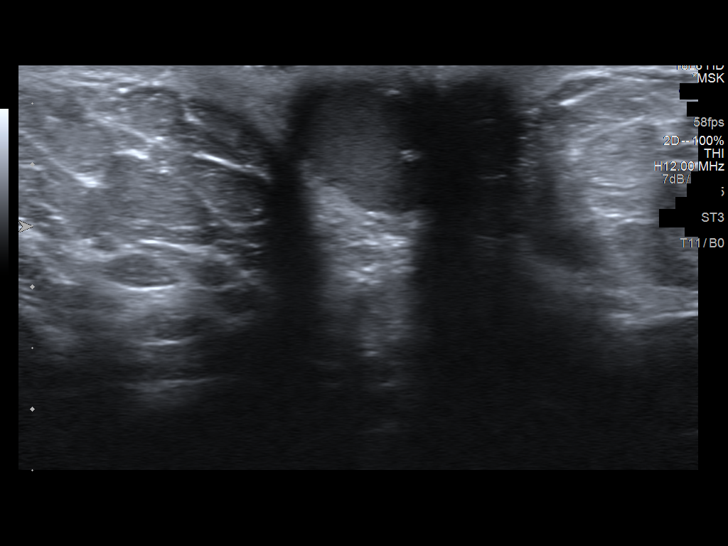
[im 11/15]
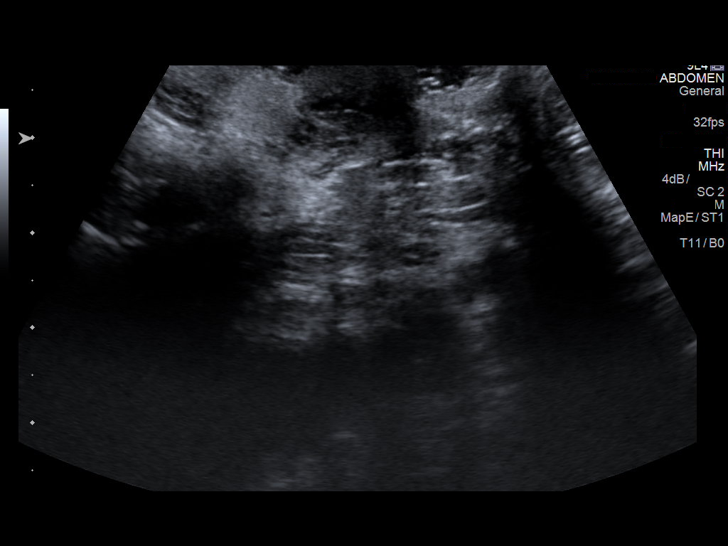
[im 12/15]
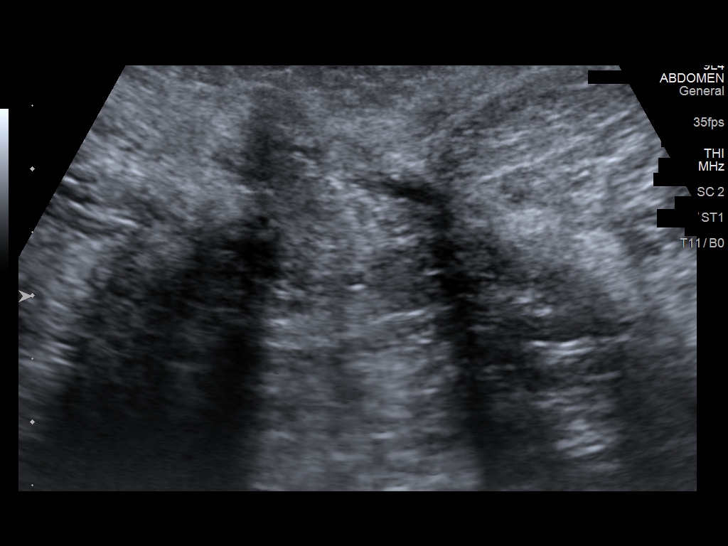
[im 13/15]
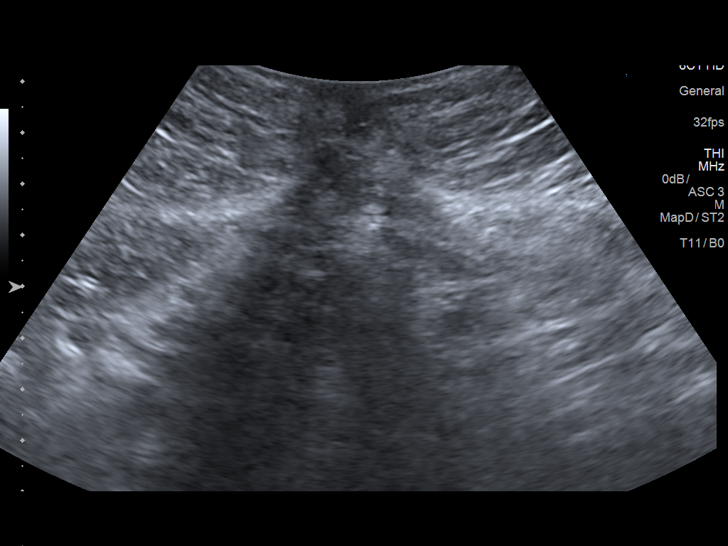
[im 14/15]
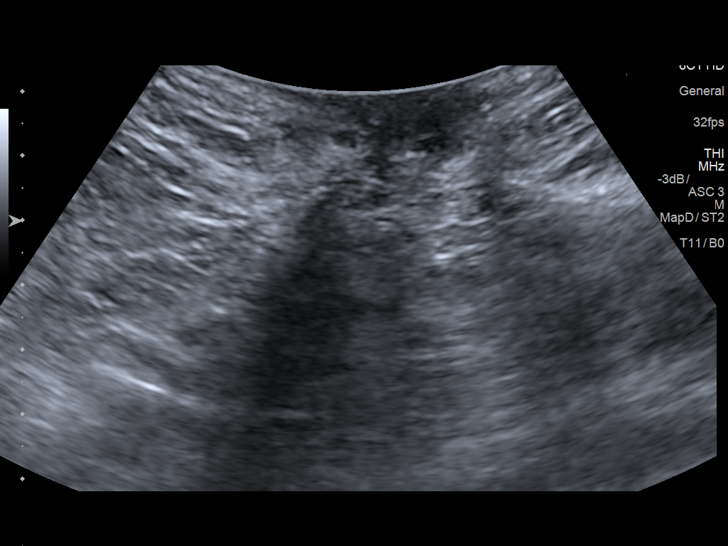
[im 15/15]
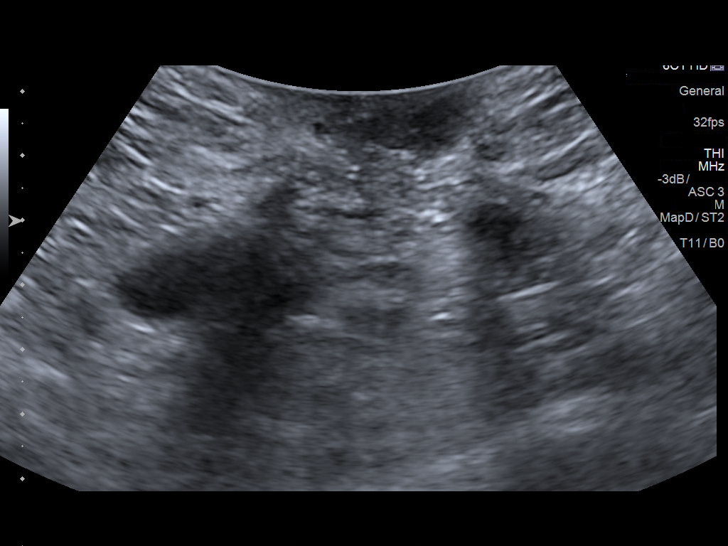

[14 of 15 positions shown; findings below may reference images not displayed]

FINDINGS: Scanning was directed toward the region of concern as indicated by
the patient. To the left of the umbilicus, there is a subcutaneous
focus measuring 1.3 cm AP by 0.8 cm transverse by 1 cm AP. The
lesion has intermediate echogenicity and increased through
transmission. It is 3 mm below the skin surface. This lesion does
not communicate with the abdominal cavity and is not bowel. There is
no flow about the lesion on Doppler imaging.
IMPRESSION: No hernia is identified. Hypoechoic focus in the subcutaneous
tissues as described above is nonspecific. It could represent a
sebaceous cyst, fat necrosis or less likely hematoma. It does not
appear aggressive.

## 2020-01-08 ENCOUNTER — Encounter: Payer: Self-pay | Admitting: Adult Health

## 2020-09-19 ENCOUNTER — Other Ambulatory Visit: Payer: Self-pay | Admitting: Physician Assistant

## 2020-09-19 DIAGNOSIS — Z0000 Encounter for general adult medical examination without abnormal findings: Secondary | ICD-10-CM

## 2020-09-20 ENCOUNTER — Other Ambulatory Visit: Payer: Self-pay

## 2020-09-20 ENCOUNTER — Other Ambulatory Visit: Payer: PRIVATE HEALTH INSURANCE

## 2020-09-20 DIAGNOSIS — Z0000 Encounter for general adult medical examination without abnormal findings: Secondary | ICD-10-CM

## 2020-09-21 LAB — TSH: TSH: 3.05 u[IU]/mL (ref 0.450–4.500)

## 2020-09-21 LAB — CBC
Hematocrit: 44.1 % (ref 34.0–46.6)
Hemoglobin: 14.6 g/dL (ref 11.1–15.9)
MCH: 29.9 pg (ref 26.6–33.0)
MCHC: 33.1 g/dL (ref 31.5–35.7)
MCV: 90 fL (ref 79–97)
Platelets: 212 10*3/uL (ref 150–450)
RBC: 4.89 x10E6/uL (ref 3.77–5.28)
RDW: 12.6 % (ref 11.7–15.4)
WBC: 6 10*3/uL (ref 3.4–10.8)

## 2020-09-21 LAB — COMPREHENSIVE METABOLIC PANEL
ALT: 12 IU/L (ref 0–32)
AST: 19 IU/L (ref 0–40)
Albumin/Globulin Ratio: 1.8 (ref 1.2–2.2)
Albumin: 4.4 g/dL (ref 3.9–5.0)
Alkaline Phosphatase: 77 IU/L (ref 44–121)
BUN/Creatinine Ratio: 8 — ABNORMAL LOW (ref 9–23)
BUN: 6 mg/dL (ref 6–20)
Bilirubin Total: 0.7 mg/dL (ref 0.0–1.2)
CO2: 24 mmol/L (ref 20–29)
Calcium: 9.3 mg/dL (ref 8.7–10.2)
Chloride: 101 mmol/L (ref 96–106)
Creatinine, Ser: 0.73 mg/dL (ref 0.57–1.00)
GFR calc Af Amer: 132 mL/min/{1.73_m2} (ref >59)
GFR calc non Af Amer: 115 mL/min/{1.73_m2} (ref >59)
Globulin, Total: 2.4 g/dL (ref 1.5–4.5)
Glucose: 92 mg/dL (ref 65–99)
Potassium: 4.1 mmol/L (ref 3.5–5.2)
Sodium: 137 mmol/L (ref 134–144)
Total Protein: 6.8 g/dL (ref 6.0–8.5)

## 2020-09-21 LAB — LIPID PANEL
Chol/HDL Ratio: 1.6 ratio (ref 0.0–4.4)
Cholesterol, Total: 180 mg/dL (ref 100–199)
HDL: 114 mg/dL (ref >39)
LDL Chol Calc (NIH): 56 mg/dL (ref 0–99)
Triglycerides: 52 mg/dL (ref 0–149)
VLDL Cholesterol Cal: 10 mg/dL (ref 5–40)

## 2020-09-21 LAB — HEMOGLOBIN A1C
Est. average glucose Bld gHb Est-mCnc: 103 mg/dL
Hgb A1c MFr Bld: 5.2 % (ref 4.8–5.6)

## 2020-09-27 ENCOUNTER — Other Ambulatory Visit: Payer: Self-pay

## 2020-09-27 ENCOUNTER — Encounter: Payer: Self-pay | Admitting: Physician Assistant

## 2020-09-27 ENCOUNTER — Ambulatory Visit (INDEPENDENT_AMBULATORY_CARE_PROVIDER_SITE_OTHER): Payer: PRIVATE HEALTH INSURANCE | Admitting: Physician Assistant

## 2020-09-27 VITALS — BP 116/73 | HR 65 | Temp 98.8°F | Ht 66.25 in | Wt 209.1 lb

## 2020-09-27 DIAGNOSIS — Z0000 Encounter for general adult medical examination without abnormal findings: Secondary | ICD-10-CM | POA: Diagnosis not present

## 2020-09-27 DIAGNOSIS — Z01419 Encounter for gynecological examination (general) (routine) without abnormal findings: Secondary | ICD-10-CM | POA: Diagnosis not present

## 2020-09-27 DIAGNOSIS — Z23 Encounter for immunization: Secondary | ICD-10-CM | POA: Diagnosis not present

## 2020-09-27 DIAGNOSIS — Z1159 Encounter for screening for other viral diseases: Secondary | ICD-10-CM | POA: Diagnosis not present

## 2020-09-27 DIAGNOSIS — R21 Rash and other nonspecific skin eruption: Secondary | ICD-10-CM

## 2020-09-27 MED ORDER — TRIAMCINOLONE ACETONIDE 0.1 % EX CREA: 1.0000 "application " | TOPICAL_CREAM | Freq: Two times a day (BID) | CUTANEOUS | 0 refills | Status: DC

## 2020-09-27 NOTE — Patient Instructions (Addendum)
Preventive Care 21-25 Years Old, Female Preventive care refers to visits with your health care provider and lifestyle choices that can promote health and wellness. This includes:  A yearly physical exam. This may also be called an annual well check.  Regular dental visits and eye exams.  Immunizations.  Screening for certain conditions.  Healthy lifestyle choices, such as eating a healthy diet, getting regular exercise, not using drugs or products that contain nicotine and tobacco, and limiting alcohol use. What can I expect for my preventive care visit? Physical exam Your health care provider will check your:  Height and weight. This may be used to calculate body mass index (BMI), which tells if you are at a healthy weight.  Heart rate and blood pressure.  Skin for abnormal spots. Counseling Your health care provider may ask you questions about your:  Alcohol, tobacco, and drug use.  Emotional well-being.  Home and relationship well-being.  Sexual activity.  Eating habits.  Work and work environment.  Method of birth control.  Menstrual cycle.  Pregnancy history. What immunizations do I need?  Influenza (flu) vaccine  This is recommended every year. Tetanus, diphtheria, and pertussis (Tdap) vaccine  You may need a Td booster every 10 years. Varicella (chickenpox) vaccine  You may need this if you have not been vaccinated. Human papillomavirus (HPV) vaccine  If recommended by your health care provider, you may need three doses over 6 months. Measles, mumps, and rubella (MMR) vaccine  You may need at least one dose of MMR. You may also need a second dose. Meningococcal conjugate (MenACWY) vaccine  One dose is recommended if you are age 19-21 years and a first-year college student living in a residence hall, or if you have one of several medical conditions. You may also need additional booster doses. Pneumococcal conjugate (PCV13) vaccine  You may need  this if you have certain conditions and were not previously vaccinated. Pneumococcal polysaccharide (PPSV23) vaccine  You may need one or two doses if you smoke cigarettes or if you have certain conditions. Hepatitis A vaccine  You may need this if you have certain conditions or if you travel or work in places where you may be exposed to hepatitis A. Hepatitis B vaccine  You may need this if you have certain conditions or if you travel or work in places where you may be exposed to hepatitis B. Haemophilus influenzae type b (Hib) vaccine  You may need this if you have certain conditions. You may receive vaccines as individual doses or as more than one vaccine together in one shot (combination vaccines). Talk with your health care provider about the risks and benefits of combination vaccines. What tests do I need?  Blood tests  Lipid and cholesterol levels. These may be checked every 5 years starting at age 20.  Hepatitis C test.  Hepatitis B test. Screening  Diabetes screening. This is done by checking your blood sugar (glucose) after you have not eaten for a while (fasting).  Sexually transmitted disease (STD) testing.  BRCA-related cancer screening. This may be done if you have a family history of breast, ovarian, tubal, or peritoneal cancers.  Pelvic exam and Pap test. This may be done every 3 years starting at age 21. Starting at age 30, this may be done every 5 years if you have a Pap test in combination with an HPV test. Talk with your health care provider about your test results, treatment options, and if necessary, the need for more tests.   Follow these instructions at home: Eating and drinking   Eat a diet that includes fresh fruits and vegetables, whole grains, lean protein, and low-fat dairy.  Take vitamin and mineral supplements as recommended by your health care provider.  Do not drink alcohol if: ? Your health care provider tells you not to drink. ? You are  pregnant, may be pregnant, or are planning to become pregnant.  If you drink alcohol: ? Limit how much you have to 0-1 drink a day. ? Be aware of how much alcohol is in your drink. In the [place] one drink equals one 12 oz bottle of beer (355 mL), one 5 oz glass of wine (148 mL), or one 1 oz glass of hard liquor (44 mL). Lifestyle  Take daily care of your teeth and gums.  Stay active. Exercise for at least 30 minutes on 5 or more days each week.  Do not use any products that contain nicotine or tobacco, such as cigarettes, e-cigarettes, and chewing tobacco. If you need help quitting, ask your health care provider.  If you are sexually active, practice safe sex. Use a condom or other form of birth control (contraception) in order to prevent pregnancy and STIs (sexually transmitted infections). If you plan to become pregnant, see your health care provider for a preconception visit. What's next?  Visit your health care provider once a year for a well check visit.  Ask your health care provider how often you should have your eyes and teeth checked.  Stay up to date on all vaccines. This information is not intended to replace advice given to you by your health care provider. Make sure you discuss any questions you have with your health care provider. Document Revised: 08/07/2018 Document Reviewed: 08/07/2018 Elsevier Patient Education  [place].   Premenstrual Syndrome Premenstrual syndrome (PMS) is a group of physical, emotional, and behavioral symptoms that affect women of childbearing age as part of their menstrual cycle. PMS starts 1-2 weeks before the start of a woman's menstrual period and goes away a few days after menstrual bleeding starts. It often happens in a predictable pattern (recurs). PMS may cause other health conditions to become worse, such as asthma, allergies, and migraines. PMS can range from mild to severe. When it is severe, it is called premenstrual dysphoric  disorder (PMDD). PMS may interfere with normal daily activities. What are the causes? The cause of this condition is not known, but it seems to be related to hormone changes that happen before menstruation. What are the signs or symptoms? Symptoms of this condition often happen every month. They go away completely after your period starts. Physical symptoms of this condition include:  Bloating.  Breast pain.  Headaches.  Extreme fatigue.  Backaches.  Swelling of the hands and feet.  Weight gain.  Hot flashes. Emotional and behavioral symptoms of this condition include:  Mood swings.  Depression.  Angry outbursts.  Irritability.  Anxiety.  Crying spells.  Food cravings or appetite changes.  Changes in sexual desire.  Confusion.  Aggression.  Social withdrawal.  Poor concentration. How is this diagnosed? This condition may be diagnosed based on a history of your symptoms. This condition is generally diagnosed if symptoms of PMS:  Are present in the 5 days before your period starts.  End within 4 days after your period starts.  Happen at least 3 months in a row.  Interfere with some of your normal activities. Other conditions that can cause some of these symptoms  must be ruled out before PMS can be diagnosed. These include depression, anxiety, anemia, and thyroid problems. How is this treated? This condition may be treated by:  Maintaining a healthy lifestyle. This includes eating a well-balanced diet and exercising regularly.  Taking medicines. Medicines can help relieve symptoms such as cramps, aches, pains, headaches, and breast tenderness. Depending on the severity of the condition, your health care provider may recommend various over-the-counter pain medicines. Follow these instructions at home: Eating and drinking   Eat a well-balanced diet.  Avoid caffeine and alcohol.  Limit the amount of salt and salty foods you eat. This will help reduce  bloating.  Drink enough fluid to keep your urine pale yellow.  Take a multivitamin if told to do so by your health care provider. Lifestyle   Do not use any products that contain nicotine or tobacco, such as cigarettes, e-cigarettes, and chewing tobacco. If you need help quitting, ask your health care provider.  Exercise regularly as suggested by your health care provider.  Get enough sleep. For most adults, this is 7-8 hours of sleep each night.  Practice relaxation techniques such as yoga, tai chi, or meditation.  Find healthy ways to manage stress. General instructions   For 2-3 months, write down your symptoms, their severity, and how long they last. This will help your health care provider choose the best treatment for you.  Take over-the-counter and prescription medicines only as told by your health care provider.  If you are using birth control pills (oral contraceptives), use them as told by your health care provider. Contact a health care provider if:  Your symptoms get worse.  You develop new symptoms.  You have trouble doing your daily activities. Summary  Premenstrual syndrome (PMS) is a group of physical, emotional, and behavioral symptoms that affect women of childbearing age.  PMS starts 1-2 weeks before the start of a woman's period and goes away a few days after the period starts.  PMS is treated by maintaining a healthy lifestyle and taking medicines to relieve the symptoms. This information is not intended to replace advice given to you by your health care provider. Make sure you discuss any questions you have with your health care provider. Document Revised: 07/09/2018 Document Reviewed: 07/09/2018 Elsevier Patient Education  [place].

## 2020-09-27 NOTE — Progress Notes (Signed)
Female Physical   Impression and Recommendations:    1. Healthcare maintenance   2. Encounter for hepatitis C screening test for low risk patient   3. Need for influenza vaccination   4. Encounter for well woman exam with routine gynecological exam   5. Rash      1) Anticipatory Guidance: Discussed skin CA prevention and sunscreen when outside along with skin surveillance; eating a balanced and modest diet; physical activity at least 25 minutes per day or minimum of 150 min/ week moderate to intense activity.  2) Immunizations / Screenings / Labs:   All immunizations are up-to-date per recommendations or will be updated today if pt allows.    - Patient understands with dental and vision screens they will schedule independently.  - Obtained CBC, CMP, HgA1c, Lipid panel, and TSH when fasting.  Most labs are essentially within normal limits. - Agreeable to hep C screening and influenza vaccine.   3) Weight:  Discussed goal to improve diet habits to improve overall feelings of well being and objective health data. Improve nutrient density of diet through increasing intake of fruits and vegetables and decreasing saturated fats, white flour products and refined sugars.  4) Healthcare Maintenance:  -Follow a heart healthy diet and stay well-hydrated. Increase physical activity. -Use triamcinolone cream as needed for rash. 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 Recommend further evaluation if rash worsens or fails to improve.   -Placed referral to OB/GYN for female exam. -Recommend to avoid tobacco use. -Follow-up in 1 year for CPE and if BW or sooner if needed   Meds ordered this encounter  Medications   triamcinolone cream (KENALOG) 0.1 %    Sig: Apply 1 application topically 2 (two) times daily.    Dispense:  30 g    Refill:  0    Order Specific Question:   Supervising Provider    Answer:   Beatrice Lecher D [[id number]]    Orders Placed This Encounter  Procedures   Flu Vaccine QUAD 36+ mos IM   Hepatitis C antibody   Ambulatory referral to Obstetrics / Gynecology     Return in about 1 year (around 09/27/2021) for CPE and FBW.      Gross side effects, risk and benefits, and alternatives of medications discussed with patient.  Patient is aware that all medications have potential side effects and we are unable to predict every side effect or drug-drug interaction that may occur.  Expresses verbal understanding and consents to current therapy plan and treatment regimen.  F-up preventative CPE in 1 year- this is in addition to any chronic care visits.    Please see orders placed and AVS handed out to patient at the end of our visit for further patient instructions/ counseling done pertaining to today's office visit.  Note:  This note was prepared with assistance of Dragon voice recognition software. Occasional wrong-word or sound-a-like substitutions may have occurred due to the inherent limitations of voice recognition software.     Subjective:     CPE HPI: Faith Bush is a 24 y.o. female who presents to [place] [facility] today a yearly health maintenance exam.   Health Maintenance Summary  - Reviewed and updated, unless pt declines services.  Tobacco History Reviewed:  Y, occasional smoker Alcohol and/or drug use:    No concerns; no use Exercise Habits:   Currently not very active Dental Home: Yes Eye exams: No  Dermatology home: No  Female Health:  PAP Smear - last known results: Please referral to OB/GYN. STD concerns:   none Birth control method: Condoms Menses regular: Yes Lumps or breast concerns:    none Breast Cancer Family History:  No   Additional concerns beyond health maintenance issues: Rash that appears about 1 week before menses starts and resolves after period x several years.    Immunization History  Administered  Date(s) Administered   DTaP 08/22/1995, 10/12/1995, 12/24/1995, 09/21/1996   Hepatitis A 01/07/2007, 10/22/2008   Hepatitis B 10/26/1995, 07/22/1995, 03/26/1996   HiB (PRP-OMP) 08/22/1995, 10/12/1995, 12/24/1995, 09/21/1996   Hpv 01/07/2007, 06/05/2007, 10/22/2008   IPV 08/22/1995, 10/12/1995, 12/24/1995, 08/22/1999   Influenza Inj Mdck Quad With Preservative 09/19/2017   Influenza,inj,Quad PF,6+ Mos 09/25/2018, 09/28/2019, 09/27/2020   MMR 06/27/1996, 08/22/1999   Meningococcal Conjugate 01/07/2007   Meningococcal Polysaccharide 08/05/2012   Tdap 01/07/2007, 12/10/2014   Varicella 09/21/1996, 01/07/2007     Health Maintenance  Topic Date Due   Hepatitis C Screening  Never done   COVID-19 Vaccine (1) Never done   PAP-Cervical Cytology Screening  08/08/2019   PAP SMEAR-Modifier  08/08/2019   TETANUS/TDAP  12/10/2024   INFLUENZA VACCINE  Completed   HIV Screening  Completed     Wt Readings from Last 3 Encounters:  09/27/20 209 lb 1.6 oz (94.8 kg)  09/28/19 205 lb 8 oz (93.2 kg)  09/25/18 197 lb 9.6 oz (89.6 kg)   BP Readings from Last 3 Encounters:  09/27/20 116/73  09/28/19 109/72  09/25/18 123/82   Pulse Readings from Last 3 Encounters:  09/27/20 65  09/28/19 71  09/25/18 74     Past Medical History:  Diagnosis Date   Eczema    Medical history non-contributory       Past Surgical History:  Procedure Laterality Date   EXCISION OF KELOID N/A 07/28/2018   Procedure: EXCISION OF UMBILICAL MASS / CYST;  Surgeon: Ralene Ok, MD;  Location: [facility];  Service: General;  Laterality: N/A;   WISDOM TOOTH EXTRACTION        History reviewed. No pertinent family history.    Social History   Substance and Sexual Activity  Drug Use Never  ,   Social History   Substance and Sexual Activity  Alcohol Use Yes   Comment: 3 drinks/week  ,   Social History   Tobacco Use  Smoking Status Never Smoker  Smokeless Tobacco Never Used   ,   Social History   Substance and Sexual Activity  Sexual Activity Yes   Birth control/protection: Pill    Current Outpatient Medications on File Prior to Visit  Medication Sig Dispense Refill   ibuprofen (ADVIL,MOTRIN) 200 MG tablet Take 400 mg by mouth every 8 (eight) hours as needed (for pain.).     Multiple Vitamin (MULTIVITAMIN WITH MINERALS) TABS tablet Take 1 tablet by mouth daily.     No current facility-administered medications on file prior to visit.    Allergies: Patient has no known allergies.  Review of Systems: General:   Denies fever, chills, unexplained weight loss.  Optho/Auditory:   Denies visual changes, blurred vision/LOV Respiratory:   Denies SOB, DOE more than baseline levels.   Cardiovascular:   Denies chest pain, palpitations, new onset peripheral edema  Gastrointestinal:   Denies nausea, vomiting, diarrhea.  Genitourinary: Denies dysuria, freq/ urgency, flank pain or discharge from genitals.  Endocrine:     Denies hot or cold intolerance, polyuria, polydipsia. Musculoskeletal:   Denies unexplained  myalgias, joint swelling, unexplained arthralgias, gait problems.  Skin:  Denies rash, suspicious lesions Neurological:     Denies dizziness, unexplained weakness, numbness  Psychiatric/Behavioral:   Denies mood changes, suicidal or homicidal ideations, hallucinations    Objective:    Blood pressure 116/73, pulse 65, temperature 98.8 F (37.1 C), temperature source Oral, height 5' 6.25" (1.683 m), weight 209 lb 1.6 oz (94.8 kg), last menstrual period 09/23/2020, SpO2 99 %. Body mass index is 33.5 kg/m. General Appearance:    Alert, cooperative, no distress, appears stated age  Head:    Normocephalic, without obvious abnormality, atraumatic  Eyes:    PERRL, conjunctiva/corneas clear, EOM's intact, fundi    benign, both eyes  Ears:    Normal TM's and external ear canals, both ears  Nose:   Nares normal, septum midline, mucosa normal, no drainage     or sinus tenderness  Throat:   Lips w/o lesion, mucosa moist, and tongue normal; teeth and   gums normal  Neck:   Supple, symmetrical, trachea midline, no adenopathy;    thyroid:  no enlargement/tenderness/nodules; no carotid   bruit or JVD  Back:     Symmetric, no curvature, ROM normal, no CVA tenderness  Lungs:     Clear to auscultation bilaterally, respirations unlabored, no       Wh/ R/ R  Chest Wall:    No tenderness or gross deformity; normal excursion   Heart:    Regular rate and rhythm, S1 and S2 normal, no murmur, rub   or gallop  Breast Exam:    No tenderness, masses, or nipple abnormality b/l; no d/c; dense breast tissue noted  Abdomen:     Soft, non-tender, bowel sounds active all four quadrants, No   G/R/R, no masses, no organomegaly  Genitalia:   Deferred to Ob-Gyn.  Rectal:   Deferred to Ob-Gyn.  Extremities:   Extremities normal, atraumatic, no cyanosis or gross edema  Pulses:   2+ and symmetric all extremities  Skin:   Warm, dry, Skin color, texture, turgor normal, no obvious rashes or lesions Psych: No HI/SI, judgement and insight good, Euthymic mood. Full Affect.  Neurologic:   CNII-XII intact, normal strength, sensation and reflexes    Throughout

## 2020-09-28 LAB — HEPATITIS C ANTIBODY: Hep C Virus Ab: <0.1 s/co ratio (ref 0.0–0.9)

## 2021-10-16 ENCOUNTER — Encounter (HOSPITAL_BASED_OUTPATIENT_CLINIC_OR_DEPARTMENT_OTHER): Payer: Self-pay

## 2021-10-16 ENCOUNTER — Emergency Department (HOSPITAL_BASED_OUTPATIENT_CLINIC_OR_DEPARTMENT_OTHER)
Admission: EM | Admit: 2021-10-16 | Discharge: 2021-10-16 | Disposition: A | Discharge status: Home / Self Care | Payer: PRIVATE HEALTH INSURANCE | Attending: Emergency Medicine | Admitting: Emergency Medicine

## 2021-10-16 ENCOUNTER — Other Ambulatory Visit: Payer: Self-pay

## 2021-10-16 DIAGNOSIS — N390 Urinary tract infection, site not specified: Secondary | ICD-10-CM | POA: Insufficient documentation

## 2021-10-16 LAB — URINALYSIS, ROUTINE W REFLEX MICROSCOPIC
Bilirubin Urine: NEGATIVE
Glucose, UA: NEGATIVE mg/dL
Ketones, ur: NEGATIVE mg/dL
Nitrite: NEGATIVE
Protein, ur: NEGATIVE mg/dL
Specific Gravity, Urine: 1.01 (ref 1.005–1.030)
pH: 6.5 (ref 5.0–8.0)

## 2021-10-16 LAB — WET PREP, GENITAL
Clue Cells Wet Prep HPF POC: NONE SEEN
Sperm: NONE SEEN
Trich, Wet Prep: NONE SEEN
Yeast Wet Prep HPF POC: NONE SEEN

## 2021-10-16 LAB — URINALYSIS, MICROSCOPIC (REFLEX)

## 2021-10-16 LAB — PREGNANCY, URINE: Preg Test, Ur: NEGATIVE

## 2021-10-16 MED ORDER — CEPHALEXIN 500 MG PO CAPS: 500.0000 mg | ORAL_CAPSULE | Freq: Two times a day (BID) | ORAL | 0 refills | Status: AC

## 2021-10-16 NOTE — ED Provider Notes (Signed)
[facility] EMERGENCY DEPARTMENT Provider Note   CSN: [id number] Arrival date & time: 10/16/21  1442     History Chief Complaint  Patient presents with   Dysuria    Faith Bush is a 25 y.o. female who presents to the ED today with complaint of dysuria for the past few days.  Also complains of urinary frequency.  She went to urgent care last week and was prescribed Macrobid.  She was told to come back in 3 days if she did not have any improvement in her symptoms.  She went back and they wanted to change her prescription for Macrobid to Cipro.  She was also prescribed Flagyl for bacterial vaginosis.  She states that she did not have a pelvic exam done nor self swabbed.  She denies any vaginal discharge.  She does have some irritation.  She states that they did not do a culture.  She wanted to have a second opinion and figure out what exactly she was growing in her urine and decided to come to the ED today.  She states she is having some mild suprapubic pain.  No nausea, vomiting, fevers, chills, flank pain.  Is currently on her menstrual cycle.   The history is provided by the patient and medical records.      Past Medical History:  Diagnosis Date   Eczema    Medical history non-contributory     Patient Active Problem List   Diagnosis Date Noted   Constipation 09/28/2019   Healthcare maintenance 05/27/2018   Eczema 08/12/2017    Past Surgical History:  Procedure Laterality Date   EXCISION OF KELOID N/A 07/28/2018   Procedure: EXCISION OF UMBILICAL MASS / CYST;  Surgeon: Axel Filler, MD;  Location: [facility] OR;  Service: General;  Laterality: N/A;   WISDOM TOOTH EXTRACTION       OB History   No obstetric history on file.     No family history on file.  Social History   Tobacco Use   Smoking status: Never   Smokeless tobacco: Never  Vaping Use   Vaping Use: Never used  Substance Use Topics   Alcohol use: Yes    Comment: weekly   Drug use: Yes     Types: Marijuana    Home Medications Prior to Admission medications   Medication Sig Start Date End Date Taking? Authorizing Provider  cephALEXin (KEFLEX) 500 MG capsule Take 1 capsule (500 mg total) by mouth 2 (two) times daily for 5 days. 10/16/21 10/21/21 Yes Cheskel Silverio, PA-C  ibuprofen (ADVIL,MOTRIN) 200 MG tablet Take 400 mg by mouth every 8 (eight) hours as needed (for pain.).    [provider]  nitrofurantoin, macrocrystal-monohydrate, (MACROBID) 100 MG capsule Take 100 mg by mouth 2 (two) times daily. 10/12/21   [provider]    Allergies    Patient has no known allergies.  Review of Systems   Review of Systems  Constitutional:  Negative for chills and fever.  Gastrointestinal:  Negative for abdominal pain, nausea and vomiting.  Genitourinary:  Positive for dysuria and frequency. Negative for flank pain, menstrual problem and pelvic pain.  All other systems reviewed and are negative.  Physical Exam Updated Vital Signs BP (!) 146/102 (BP Location: Left Arm)   Pulse 70   Temp 99 F (37.2 C) (Oral)   Resp 18   LMP 10/10/2021   SpO2 100%   Physical Exam Vitals and nursing note reviewed.  Constitutional:      Appearance:  She is not ill-appearing or diaphoretic.  HENT:     Head: Normocephalic and atraumatic.  Eyes:     Conjunctiva/sclera: Conjunctivae normal.  Cardiovascular:     Rate and Rhythm: Normal rate and regular rhythm.  Pulmonary:     Effort: Pulmonary effort is normal.     Breath sounds: Normal breath sounds. No wheezing, rhonchi or rales.  Abdominal:     Tenderness: There is no abdominal tenderness. There is no right CVA tenderness, left CVA tenderness, guarding or rebound.  Skin:    General: Skin is warm and dry.     Coloration: Skin is not jaundiced.  Neurological:     Mental Status: She is alert.    ED Results / Procedures / Treatments   Labs (all labs ordered are listed, but only abnormal results are displayed) Labs  Reviewed  WET PREP, GENITAL - Abnormal; Notable for the following components:      Result Value   WBC, Wet Prep HPF POC MODERATE (*)    All other components within normal limits  URINALYSIS, ROUTINE W REFLEX MICROSCOPIC - Abnormal; Notable for the following components:   Hgb urine dipstick LARGE (*)    Leukocytes,Ua MODERATE (*)    All other components within normal limits  URINALYSIS, MICROSCOPIC (REFLEX) - Abnormal; Notable for the following components:   Bacteria, UA FEW (*)    All other components within normal limits  URINE CULTURE  PREGNANCY, URINE  GC/CHLAMYDIA PROBE AMP ([place]) NOT AT [facility]    EKG None  Radiology No results found.  Procedures Procedures   Medications Ordered in ED Medications - No data to display  ED Course  I have reviewed the triage vital signs and the nursing notes.  Pertinent labs & imaging results that were available during my care of the patient were reviewed by me and considered in my medical decision making (see chart for details).    MDM Rules/Calculators/A&P                           25 year old female who presents to the ED today with dysuria and urinary frequency.  Was being treated with Macrobid however no improvement in symptoms.  She was seen at urgent care again today and prescribed Cipro and Flagyl however she wanted to come to the ED for a second opinion.  On arrival to the ED today vitals are stable.  Patient appears to be no acute distress.  She has no abdominal tenderness palpation on exam however states that sometimes she has mild suprapubic pain.  No CVA tenderness.  Is nontoxic-appearing.  She had UA done while in the waiting room which showed large hemoglobin, 11-20 red blood cells, 6-10 white blood cells, few bacteria as well as moderate leuks.  UPT is negative.  Urine culture is pending.  Patient is currently on her menstrual cycle.  I did have lengthy discussion with patient that we are unable to tell exactly what type  of bacteria she is growing given the urine culture will take 2 to 3 days time to come back.  I had offered to switch to Keflex given she may have some more side effects on the ciprofloxacin which she agrees with.  She does mention she was discharged with Flagyl earlier today at urgent care for BV however was not tested for BV and they clinically diagnosed her with same.  She does report some vaginal irritation.  We will plan for patient to  self swab.   Wet prep negative Will discharge patient home at this time with Keflex.  She is instructed to await urine culture.  We will call her if the antibiotic needs to be changed.  She is recommended to have her urine rechecked in about 1 week to ensure she has cleared the infection.  She is in agreement with plan at this time and stable for discharge.   This note was prepared using Dragon voice recognition software and may include unintentional dictation errors due to the inherent limitations of voice recognition software.  Final Clinical Impression(s) / ED Diagnoses Final diagnoses:  Lower urinary tract infectious disease    Rx / DC Orders ED Discharge Orders          Ordered    cephALEXin (KEFLEX) 500 MG capsule  2 times daily        10/16/21 1652             Discharge Instructions      Please pick up antibiotic and take as prescribed.  I would not encourage you to pick up the prescriptions prescribed at urgent care today.  We have sent your urine for culture and we will call you in 2 to 3 days time if the antibiotic needs to be changed.  You can also follow-up on the results via MyChart.  Please have your urine rechecked in about 1 week to ensure you have cleared the infection.  Return to the ED for any new/worsening symptoms including no improvement after antibiotics, fevers, back pain, nausea, vomiting.       Tanda Rockers, PA-C 10/16/21 1653    Charlynne Pander, MD 10/17/21 [phone]

## 2021-10-16 NOTE — ED Notes (Signed)
Pt states 4 days of macrobid for DX UTI with no improvement. UC prescribed Flagyl and and cipro today after POC urine dip.

## 2021-10-16 NOTE — ED Triage Notes (Addendum)
Pt c/o dysuria and freq x 1 week-NAD-steady gait-pt states she was seen at UC-took abx x 3 days-went back for recheck-was advised of plan to change abx-states she did not feel good about changing abx when unknown what treating due to urine cx not done

## 2021-10-16 NOTE — Discharge Instructions (Signed)
Please pick up antibiotic and take as prescribed.  I would not encourage you to pick up the prescriptions prescribed at urgent care today.  We have sent your urine for culture and we will call you in 2 to 3 days time if the antibiotic needs to be changed.  You can also follow-up on the results via MyChart.  Please have your urine rechecked in about 1 week to ensure you have cleared the infection.  Return to the ED for any new/worsening symptoms including no improvement after antibiotics, fevers, back pain, nausea, vomiting.

## 2021-10-17 LAB — URINE CULTURE: Culture: <10000 — AB

## 2021-10-17 LAB — GC/CHLAMYDIA PROBE AMP (~~LOC~~) NOT AT ARMC
Chlamydia: NEGATIVE
Comment: NEGATIVE
Comment: NORMAL
Neisseria Gonorrhea: NEGATIVE

## 2023-11-01 ENCOUNTER — Ambulatory Visit (INDEPENDENT_AMBULATORY_CARE_PROVIDER_SITE_OTHER): Payer: BC Managed Care – PPO | Admitting: Family

## 2023-11-01 ENCOUNTER — Encounter: Payer: Self-pay | Admitting: Family

## 2023-11-01 VITALS — BP 126/88 | HR 73 | Temp 98.8°F | Ht 66.0 in | Wt 198.4 lb

## 2023-11-01 DIAGNOSIS — Z7689 Persons encountering health services in other specified circumstances: Secondary | ICD-10-CM

## 2023-11-01 DIAGNOSIS — Z131 Encounter for screening for diabetes mellitus: Secondary | ICD-10-CM

## 2023-11-01 DIAGNOSIS — Z0000 Encounter for general adult medical examination without abnormal findings: Secondary | ICD-10-CM

## 2023-11-01 DIAGNOSIS — Z1329 Encounter for screening for other suspected endocrine disorder: Secondary | ICD-10-CM

## 2023-11-01 DIAGNOSIS — Z130 Encounter for screening for diseases of the blood and blood-forming organs and certain disorders involving the immune mechanism: Secondary | ICD-10-CM

## 2023-11-01 DIAGNOSIS — Z13228 Encounter for screening for other metabolic disorders: Secondary | ICD-10-CM | POA: Diagnosis not present

## 2023-11-01 DIAGNOSIS — J358 Other chronic diseases of tonsils and adenoids: Secondary | ICD-10-CM

## 2023-11-01 DIAGNOSIS — Z13220 Encounter for screening for lipoid disorders: Secondary | ICD-10-CM | POA: Diagnosis not present

## 2023-11-01 DIAGNOSIS — Z124 Encounter for screening for malignant neoplasm of cervix: Secondary | ICD-10-CM

## 2023-11-01 DIAGNOSIS — D1722 Benign lipomatous neoplasm of skin and subcutaneous tissue of left arm: Secondary | ICD-10-CM
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
## 2023-11-02 LAB — CMP14+EGFR
ALT: 10 [IU]/L (ref 0–32)
AST: 17 [IU]/L (ref 0–40)
Albumin: 4.8 g/dL (ref 4.0–5.0)
Alkaline Phosphatase: 73 [IU]/L (ref 44–121)
BUN/Creatinine Ratio: 9 (ref 9–23)
BUN: 7 mg/dL (ref 6–20)
Bilirubin Total: 0.7 mg/dL (ref 0.0–1.2)
CO2: 24 mmol/L (ref 20–29)
Calcium: 9.9 mg/dL (ref 8.7–10.2)
Chloride: 102 mmol/L (ref 96–106)
Creatinine, Ser: 0.75 mg/dL (ref 0.57–1.00)
Globulin, Total: 2.8 g/dL (ref 1.5–4.5)
Glucose: 85 mg/dL (ref 70–99)
Potassium: 4.1 mmol/L (ref 3.5–5.2)
Sodium: 139 mmol/L (ref 134–144)
Total Protein: 7.6 g/dL (ref 6.0–8.5)
eGFR: 111 mL/min/{1.73_m2} (ref >59)

## 2023-11-02 LAB — CBC
Hematocrit: 42.9 % (ref 34.0–46.6)
Hemoglobin: 14.1 g/dL (ref 11.1–15.9)
MCH: 29 pg (ref 26.6–33.0)
MCHC: 32.9 g/dL (ref 31.5–35.7)
MCV: 88 fL (ref 79–97)
Platelets: 270 10*3/uL (ref 150–450)
RBC: 4.86 x10E6/uL (ref 3.77–5.28)
RDW: 12.6 % (ref 11.7–15.4)
WBC: 5.6 10*3/uL (ref 3.4–10.8)

## 2023-11-02 LAB — HEMOGLOBIN A1C
Est. average glucose Bld gHb Est-mCnc: 105 mg/dL
Hgb A1c MFr Bld: 5.3 % (ref 4.8–5.6)

## 2023-11-02 LAB — LIPID PANEL
Chol/HDL Ratio: 1.5 ratio (ref 0.0–4.4)
Cholesterol, Total: 195 mg/dL (ref 100–199)
HDL: 126 mg/dL (ref >39)
LDL Chol Calc (NIH): 60 mg/dL (ref 0–99)
Triglycerides: 41 mg/dL (ref 0–149)
VLDL Cholesterol Cal: 9 mg/dL (ref 5–40)

## 2023-11-02 LAB — TSH: TSH: 1.26 u[IU]/mL (ref 0.450–4.500)

## 2023-11-11 DIAGNOSIS — D1722 Benign lipomatous neoplasm of skin and subcutaneous tissue of left arm: Secondary | ICD-10-CM | POA: Diagnosis not present

## 2023-11-12 ENCOUNTER — Telehealth: Payer: Self-pay | Admitting: Family

## 2023-12-09 ENCOUNTER — Ambulatory Visit
Admission: EM | Admit: 2023-12-09 | Discharge: 2023-12-09 | Disposition: A | Discharge status: Home / Self Care | Payer: BC Managed Care – PPO

## 2023-12-09 ENCOUNTER — Other Ambulatory Visit: Payer: Self-pay

## 2023-12-09 ENCOUNTER — Encounter: Payer: Self-pay | Admitting: Emergency Medicine

## 2023-12-09 DIAGNOSIS — R2232 Localized swelling, mass and lump, left upper limb: Secondary | ICD-10-CM | POA: Diagnosis not present

## 2023-12-09 DIAGNOSIS — M25532 Pain in left wrist: Secondary | ICD-10-CM | POA: Diagnosis not present

## 2023-12-09 DIAGNOSIS — J029 Acute pharyngitis, unspecified: Secondary | ICD-10-CM | POA: Diagnosis not present

## 2023-12-09 LAB — POCT RAPID STREP A (OFFICE): Rapid Strep A Screen: NEGATIVE

## 2023-12-09 NOTE — ED Provider Notes (Signed)
[facility]    CSN: [id number] Arrival date & time: 12/09/23  4098      History   Chief Complaint Chief Complaint  Patient presents with   Sore Throat    HPI Faith Bush is a 27 y.o. female.   27 year old female pt, Faith Bush, presents to urgent care for evaluation of swollen tonsils with white patchy area x 4 days.  She reports throat is uncomfortable when she swallows, like food is getting stuck.  Patient reports she has had recurrent episodes of inflamed tonsils over the last 2 years.  She denies any recent illness or strep exposure  The history is provided by the patient. No language interpreter was used.    Past Medical History:  Diagnosis Date   Eczema    Medical history non-contributory     Patient Active Problem List   Diagnosis Date Noted   Acute pharyngitis 12/09/2023   Constipation 09/28/2019   Healthcare maintenance 05/27/2018   Eczema 08/12/2017    Past Surgical History:  Procedure Laterality Date   EXCISION OF KELOID N/A 07/28/2018   Procedure: EXCISION OF UMBILICAL MASS / CYST;  Surgeon: Axel Filler, MD;  Location: [facility] OR;  Service: General;  Laterality: N/A;   WISDOM TOOTH EXTRACTION      OB History   No obstetric history on file.      Home Medications    Prior to Admission medications   Medication Sig Start Date End Date Taking? Authorizing Provider  amoxicillin-clavulanate (AUGMENTIN) 875-125 MG tablet Take 1 tablet by mouth 2 (two) times daily. Patient not taking: Reported on 12/09/2023 12/27/17   [provider]  ibuprofen (ADVIL,MOTRIN) 200 MG tablet Take 400 mg by mouth every 8 (eight) hours as needed (for pain.). Patient not taking: Reported on 11/01/2023    [provider]  Norgestimate-Ethinyl Estradiol Triphasic 0.18/0.215/0.25 MG-35 MCG tablet Take 1 tablet by mouth daily. Patient not taking: Reported on 12/09/2023 11/28/17   [provider]    Family History Family History   Problem Relation Age of Onset   Hypertension Father     Social History Social History   Tobacco Use   Smoking status: Never   Smokeless tobacco: Never  Vaping Use   Vaping status: Never Used  Substance Use Topics   Alcohol use: Yes    Comment: weekly   Drug use: Yes    Types: Marijuana     Allergies   Patient has no known allergies.   Review of Systems Review of Systems  Constitutional:  Negative for fever.  HENT:  Positive for sore throat. Negative for voice change.   All other systems reviewed and are negative.    Physical Exam Triage Vital Signs ED Triage Vitals  Encounter Vitals Group     BP 12/09/23 1126 138/83     Systolic BP Percentile --      Diastolic BP Percentile --      Pulse Rate 12/09/23 1126 65     Resp 12/09/23 1126 18     Temp 12/09/23 1126 97.7 F (36.5 C)     Temp Source 12/09/23 1126 Oral     SpO2 12/09/23 1126 100 %     Weight --      Height --      Head Circumference --      Peak Flow --      Pain Score 12/09/23 1128 0     Pain Loc --      Pain  Education --      Exclude from [employer] Chart --    No data found.  Updated Vital Signs BP 138/83 (BP Location: Right Arm)   Pulse 65   Temp 97.7 F (36.5 C) (Oral)   Resp 18   LMP 12/02/2023 (Exact Date)   SpO2 100%   Visual Acuity Right Eye Distance:   Left Eye Distance:   Bilateral Distance:    Right Eye Near:   Left Eye Near:    Bilateral Near:     Physical Exam Vitals and nursing note reviewed.  Constitutional:      General: She is not in acute distress.    Appearance: She is well-developed and well-groomed.  HENT:     Head: Normocephalic and atraumatic.     Right Ear: Tympanic membrane is retracted.     Left Ear: Tympanic membrane is retracted.     Nose: Nose normal.     Mouth/Throat:     Lips: Pink.     Mouth: Mucous membranes are moist.     Pharynx: Uvula midline. Posterior oropharyngeal erythema present.     Tonsils: No tonsillar exudate or tonsillar  abscesses.  Eyes:     Conjunctiva/sclera: Conjunctivae normal.  Cardiovascular:     Rate and Rhythm: Normal rate and regular rhythm.     Pulses: Normal pulses.     Heart sounds: Normal heart sounds. No murmur heard. Pulmonary:     Effort: Pulmonary effort is normal. No respiratory distress.     Breath sounds: Normal breath sounds and air entry.  Abdominal:     Palpations: Abdomen is soft.     Tenderness: There is no abdominal tenderness.  Musculoskeletal:        General: No swelling.     Cervical back: Neck supple.  Skin:    General: Skin is warm and dry.     Capillary Refill: Capillary refill takes less than 2 seconds.  Neurological:     General: No focal deficit present.     Mental Status: She is alert and oriented to person, place, and time.     GCS: GCS eye subscore is 4. GCS verbal subscore is 5. GCS motor subscore is 6.     Cranial Nerves: No cranial nerve deficit.     Sensory: No sensory deficit.  Psychiatric:        Attention and Perception: Attention normal.        Mood and Affect: Mood normal.        Speech: Speech normal.        Behavior: Behavior is cooperative.      UC Treatments / Results  Labs (all labs ordered are listed, but only abnormal results are displayed) Labs Reviewed  POCT RAPID STREP A (OFFICE) - Normal  CULTURE, GROUP A STREP [facility])    EKG   Radiology No results found.  Procedures Procedures (including critical care time)  Medications Ordered in UC Medications - No data to display  Initial Impression / Assessment and Plan / UC Course  I have reviewed the triage vital signs and the nursing notes.  Pertinent labs & imaging results that were available during my care of the patient were reviewed by me and considered in my medical decision making (see chart for details).  Clinical Course as of 12/09/23 1259  Mon Dec 09, 2023  1209 Rapid strep is negative, culture pending [JD]    Clinical Course User Index [JD] Press Casale, Para March,  NP   Discussed exam findings and  plan of care with patient, will treat for strep if culture returns positive, strict go to ER precautions given, patient verbalized understanding to this provider.  Ddx: Pharyngitis viral versus bacterial, allergies Final Clinical Impressions(s) / UC Diagnoses   Final diagnoses:  Acute pharyngitis, unspecified etiology     Discharge Instructions      Your strep test is negative, culture is pending, check MyChart for results.  If your throat culture is positive we will call you in an antibiotic at that time.  Try warm salt water gargles, Chloraseptic throat lozenges, Tylenol, ibuprofen as label directed for weight-based dosing     ED Prescriptions   None    PDMP not reviewed this encounter.   Clancy Gourd, NP 12/09/23 1259

## 2023-12-09 NOTE — Discharge Instructions (Addendum)
Your strep test is negative, culture is pending, check MyChart for results.  If your throat culture is positive we will call you in an antibiotic at that time.  Try warm salt water gargles, Chloraseptic throat lozenges, Tylenol, ibuprofen as label directed for weight-based dosing

## 2023-12-09 NOTE — ED Triage Notes (Signed)
Pt reports swollen tonsils with white patchy areas x4 days. States throat does not really feel sore just uncomfortable when she swallows like food is getting stuck. Pt reports she has recurrent episodes of inflamed tonsils over the last 2 years.

## 2023-12-12 LAB — CULTURE, GROUP A STREP (THRC)

## 2023-12-27 DIAGNOSIS — R2232 Localized swelling, mass and lump, left upper limb: Secondary | ICD-10-CM | POA: Diagnosis not present

## 2024-01-01 ENCOUNTER — Encounter: Payer: BC Managed Care – PPO | Admitting: Radiology

## 2024-01-02 DIAGNOSIS — R2232 Localized swelling, mass and lump, left upper limb: Secondary | ICD-10-CM | POA: Diagnosis not present

## 2024-01-06 ENCOUNTER — Encounter (INDEPENDENT_AMBULATORY_CARE_PROVIDER_SITE_OTHER): Payer: Self-pay | Admitting: Otolaryngology

## 2024-01-06 ENCOUNTER — Ambulatory Visit (INDEPENDENT_AMBULATORY_CARE_PROVIDER_SITE_OTHER): Payer: BC Managed Care – PPO | Admitting: Otolaryngology

## 2024-01-06 VITALS — BP 147/100 | HR 88 | Ht 66.0 in | Wt 198.0 lb

## 2024-01-06 DIAGNOSIS — J3501 Chronic tonsillitis: Secondary | ICD-10-CM

## 2024-01-06 DIAGNOSIS — J358 Other chronic diseases of tonsils and adenoids: Secondary | ICD-10-CM | POA: Diagnosis not present

## 2024-01-06 DIAGNOSIS — J351 Hypertrophy of tonsils: Secondary | ICD-10-CM

## 2024-01-06 NOTE — Progress Notes (Unsigned)
ENT CONSULT:  Reason for Consult: tonsil stones x 2 yrs chronic tonsillitis   HPI: Discussed the use of AI scribe software for clinical note transcription with the patient, who gave verbal consent to proceed.  History of Present Illness   The patient is a 79 yoF, with a history of seasonal allergies, initially noticed tonsil stones approximately two years ago. Over the past six months, they have experienced an increase in frequency of these stones, leading to difficulty swallowing and a sensation of food lodged in the back of the throat. The patient denies pain during swallowing, but describes the sensation as uncomfortable. They also report episodes of swollen, red, and uncomfortable tonsils.They have attempted to remove the tonsil stones in the past, with limited success. She currently manages her seasonal allergies with Claritin as needed and reports occasional heartburn, but not on a regular basis.     Past Medical History:  Diagnosis Date   Eczema    Medical history non-contributory     Past Surgical History:  Procedure Laterality Date   EXCISION OF KELOID N/A 07/28/2018   Procedure: EXCISION OF UMBILICAL MASS / CYST;  Surgeon: Axel Filler, MD;  Location: [facility] OR;  Service: General;  Laterality: N/A;   WISDOM TOOTH EXTRACTION      Family History  Problem Relation Age of Onset   Hypertension Father     Social History:  reports that she has never smoked. She has never used smokeless tobacco. She reports current alcohol use. She reports current drug use. Drug: Marijuana.  Allergies: No Known Allergies  Medications: I have reviewed the patient's current medications.  The PMH, PSH, Medications, Allergies, and SH were reviewed and updated.  ROS: Constitutional: Negative for fever, weight loss and weight gain. Cardiovascular: Negative for chest pain and dyspnea on exertion. Respiratory: Is not experiencing shortness of breath at rest. Gastrointestinal: Negative for nausea and  vomiting. Neurological: Negative for headaches. Psychiatric: The patient is not nervous/anxious  Blood pressure (!) 147/100, pulse 88, height 5\' 6"  (1.676 m), weight 198 lb (89.8 kg), last menstrual period 12/02/2023, SpO2 96%.  PHYSICAL EXAM:  Exam: General: Well-developed, well-nourished Respiratory Respiratory effort: Equal inspiration and expiration without stridor Cardiovascular Peripheral Vascular: Warm extremities with equal color/perfusion Eyes: No nystagmus with equal extraocular motion bilaterally Neuro/Psych/Balance: Patient oriented to person, place, and time; Appropriate mood and affect; Gait is intact with no imbalance; Cranial nerves I-XII are intact Head and Face Inspection: Normocephalic and atraumatic without mass or lesion Palpation: Facial skeleton intact without bony stepoffs Salivary Glands: No mass or tenderness Facial Strength: Facial motility symmetric and full bilaterally ENT Pinna: External ear intact and fully developed External canal: Canal is patent with intact skin Tympanic Membrane: Clear and mobile External Nose: No scar or anatomic deformity Internal Nose: Septum is relatively straight on anterior rhinoscopy. No polyp, or purulence.  Lips, Teeth, and gums: Mucosa and teeth intact and viable TMJ: No pain to palpation with full mobility Oral cavity/oropharynx: No erythema or exudate, no lesions present. Tonsils 2-3+ cryptic no tonsil stones identified.  Neck Neck and Trachea: Midline trachea without mass or lesion Thyroid: No mass or nodularity Lymphatics: No lymphadenopathy  Procedure:none  Assessment/Plan: Encounter Diagnoses  Name Primary?   Tonsillar hypertrophy    Chronic tonsillitis Yes   Tonsillolith     Assessment and Plan    Chronic tonsillitis and Tonsil stones  Chronic tonsil stones for two years, with increased frequency and discomfort over the past six months. Symptoms include dysphagia, sensation of  food impaction, and  occasional tonsillar swelling and erythema. Examination reveals enlarged and inflamed tonsils 3+ b/l without visible tonsil stones. Due to chronic symptoms and enlarged tonsils, tonsillectomy is advised. Risks include a chance of postoperative hemorrhage, potential need for return to OR for cauterization, and significant postoperative pain. Recovery period is approximately two weeks, with the first week being particularly painful. Emphasized the importance of hydration during recovery. - Recommend tonsillectomy - after discussion of risks and benefits she would like to proceed - Schedule outpatient tonsillectomy and coordinate with surgery schedulers  Seasonal Allergies and nasal congestion  Managed with as-needed loratadine. No current symptoms reported. - Continue as-needed loratadine  Occasional Heartburn Occasional heartburn without regular symptoms or treatment. - No specific treatment required  Follow-up - Coordinate tonsillectomy date with surgery schedulers - Ensure understanding of postoperative care instructions and follow-up requirements.     Thank you for allowing me to participate in the care of this patient. Please do not hesitate to contact me with any questions or concerns.   Ashok Croon, MD Otolaryngology [facility] Phone: [phone] Fax: [phone]    01/07/2024, 2:18 PM

## 2024-01-16 ENCOUNTER — Ambulatory Visit: Payer: BC Managed Care – PPO | Admitting: Nurse Practitioner

## 2024-01-16 ENCOUNTER — Encounter: Payer: Self-pay | Admitting: Nurse Practitioner

## 2024-01-16 ENCOUNTER — Other Ambulatory Visit (HOSPITAL_COMMUNITY)
Admission: RE | Admit: 2024-01-16 | Discharge: 2024-01-16 | Disposition: A | Discharge status: Home / Self Care | Payer: BC Managed Care – PPO | Source: Ambulatory Visit | Attending: Nurse Practitioner | Admitting: Nurse Practitioner

## 2024-01-16 VITALS — BP 118/68 | HR 73 | Ht 67.0 in | Wt 202.0 lb

## 2024-01-16 DIAGNOSIS — Z124 Encounter for screening for malignant neoplasm of cervix: Secondary | ICD-10-CM | POA: Diagnosis not present

## 2024-01-16 DIAGNOSIS — N898 Other specified noninflammatory disorders of vagina: Secondary | ICD-10-CM | POA: Diagnosis not present

## 2024-01-16 DIAGNOSIS — Z01419 Encounter for gynecological examination (general) (routine) without abnormal findings: Secondary | ICD-10-CM

## 2024-01-16 DIAGNOSIS — N760 Acute vaginitis: Secondary | ICD-10-CM | POA: Diagnosis not present

## 2024-01-16 DIAGNOSIS — Z113 Encounter for screening for infections with a predominantly sexual mode of transmission: Secondary | ICD-10-CM | POA: Diagnosis not present

## 2024-01-16 DIAGNOSIS — N946 Dysmenorrhea, unspecified: Secondary | ICD-10-CM | POA: Diagnosis not present

## 2024-01-16 LAB — WET PREP FOR TRICH, YEAST, CLUE

## 2024-01-16 MED ORDER — METRONIDAZOLE 0.75 % VA GEL: 1.0000 | Freq: Every day | VAGINAL | 0 refills | Status: AC

## 2024-01-16 NOTE — Progress Notes (Signed)
 Faith Bush 09/06/1996 [id number]   History:  28 y.o. G0 presents as new patient to establish care. Monthly cycles. Reports last period was more painful that normal. Bleeding pattern was normal. Normal pap history. Completed HPV series. Would like STD screening today.   Gynecologic History Patient's last menstrual period was 01/01/2024 (approximate). Period Duration (Days): 7 Menstrual Flow: Moderate Menstrual Control: Tampon, Maxi pad Dysmenorrhea: (!) Mild Dysmenorrhea Symptoms: Diarrhea, Other (Comment) (constipation at times) Contraception/Family planning: none Sexually active: Yes  Health Maintenance Last Pap: 3 years ago per patient. Results were: Normal Last mammogram: Not indicated  Last colonoscopy: Not indicated  Last Dexa: Not indicated  Exercising: No Smoker: no   Past medical history, past surgical history, family history and social history were all reviewed and documented in the EPIC chart.  ROS:  A ROS was performed and pertinent positives and negatives are included.  Exam:  Vitals:   01/16/24 1339  BP: 118/68  Pulse: 73  SpO2: 99%  Weight: 202 lb (91.6 kg)  Height: 5' 7 (1.702 m)   Body mass index is 31.64 kg/m.  General appearance:  Normal Thyroid :  Symmetrical, normal in size, without palpable masses or nodularity. Respiratory  Auscultation:  Clear without wheezing or rhonchi Cardiovascular  Auscultation:  Regular rate, without rubs, murmurs or gallops  Edema/varicosities:  Not grossly evident Abdominal  Soft,nontender, without masses, guarding or rebound.  Liver/spleen:  No organomegaly noted  Hernia:  None appreciated  Skin  Inspection:  Grossly normal Breasts: Examined lying and sitting.   Right: Without masses, retractions, nipple discharge or axillary adenopathy.   Left: Without masses, retractions, nipple discharge or axillary adenopathy. Pelvic: External genitalia:  no lesions              Urethra:  normal appearing urethra  with no masses, tenderness or lesions              Bartholins and Skenes: normal                 Vagina: copious, white discharge. No erythema              Cervix: no lesions Bimanual Exam:  Uterus:  no masses or tenderness              Adnexa: no mass, fullness, tenderness              Rectovaginal: Deferred              Anus:  normal, no lesions  Patient informed chaperone available to be present for breast and pelvic exam. Patient has requested no chaperone to be present. Patient has been advised what will be completed during breast and pelvic exam.   Wet prep + clue cells (+ odor)  Assessment/Plan:  28 y.o. G0 to establish care.   Well female exam with routine gynecological exam - Education provided on SBEs, importance of preventative screenings, current guidelines, high calcium diet, regular exercise, and multivitamin daily. Labs with PCP.   Cervical cancer screening - Plan: Cytology - PAP( [place]). Normal pap history.   Screening examination for STD (sexually transmitted disease) - Plan: HIV Antibody (routine testing w rflx), RPR, Cytology - PAP( [place]). GC/CT/trich added to pap.   Vaginal discharge - Plan: WET PREP FOR TRICH, YEAST, CLUE. + clue cells.   Dysmenorrhea - will monitor. Pain was with most recent cycle only.   Bacterial vaginosis - Plan: metroNIDAZOLE  (METROGEL ) 0.75 % vaginal gel nightly x 5 nights.  Return if symptoms worsen or fail to improve.    Faith DELENA Shutter DNP, 2:16 PM 01/16/2024

## 2024-01-17 LAB — HIV ANTIBODY (ROUTINE TESTING W REFLEX): HIV 1&2 Ab, 4th Generation: NONREACTIVE

## 2024-01-17 LAB — RPR: RPR Ser Ql: NONREACTIVE

## 2024-01-22 LAB — CYTOLOGY - PAP
Adequacy: ABSENT
Chlamydia: NEGATIVE
Comment: NEGATIVE
Comment: NEGATIVE
Comment: NORMAL
Diagnosis: NEGATIVE
Neisseria Gonorrhea: NEGATIVE
Trichomonas: NEGATIVE

## 2024-01-23 ENCOUNTER — Encounter: Payer: Self-pay | Admitting: Nurse Practitioner

## 2024-03-23 ENCOUNTER — Encounter (HOSPITAL_BASED_OUTPATIENT_CLINIC_OR_DEPARTMENT_OTHER): Payer: Self-pay

## 2024-03-23 ENCOUNTER — Other Ambulatory Visit: Payer: Self-pay

## 2024-03-31 ENCOUNTER — Ambulatory Visit (HOSPITAL_BASED_OUTPATIENT_CLINIC_OR_DEPARTMENT_OTHER): Admitting: Anesthesiology

## 2024-03-31 ENCOUNTER — Other Ambulatory Visit (INDEPENDENT_AMBULATORY_CARE_PROVIDER_SITE_OTHER): Payer: Self-pay | Admitting: Otolaryngology

## 2024-03-31 ENCOUNTER — Other Ambulatory Visit: Payer: Self-pay

## 2024-03-31 ENCOUNTER — Encounter (HOSPITAL_BASED_OUTPATIENT_CLINIC_OR_DEPARTMENT_OTHER): Payer: Self-pay

## 2024-03-31 ENCOUNTER — Encounter (HOSPITAL_BASED_OUTPATIENT_CLINIC_OR_DEPARTMENT_OTHER)
Admission: RE | Disposition: A | Discharge status: Home / Self Care | Payer: Self-pay | Source: Home / Self Care | Attending: Otolaryngology

## 2024-03-31 ENCOUNTER — Ambulatory Visit (HOSPITAL_BASED_OUTPATIENT_CLINIC_OR_DEPARTMENT_OTHER)
Admission: RE | Admit: 2024-03-31 | Discharge: 2024-03-31 | Disposition: A | Discharge status: Home / Self Care | Payer: BC Managed Care – PPO | Attending: Otolaryngology | Admitting: Otolaryngology

## 2024-03-31 DIAGNOSIS — J0391 Acute recurrent tonsillitis, unspecified: Secondary | ICD-10-CM | POA: Diagnosis not present

## 2024-03-31 DIAGNOSIS — Z01818 Encounter for other preprocedural examination: Secondary | ICD-10-CM

## 2024-03-31 DIAGNOSIS — J351 Hypertrophy of tonsils: Secondary | ICD-10-CM

## 2024-03-31 DIAGNOSIS — J3501 Chronic tonsillitis: Secondary | ICD-10-CM | POA: Diagnosis not present

## 2024-03-31 HISTORY — PX: TONSILLECTOMY: SHX5217

## 2024-03-31 LAB — POCT PREGNANCY, URINE: Preg Test, Ur: NEGATIVE

## 2024-03-31 SURGERY — TONSILLECTOMY
Anesthesia: General | Site: Throat | Laterality: Bilateral

## 2024-03-31 MED ORDER — ACETAMINOPHEN 500 MG PO TABS: 500.0000 mg | ORAL_TABLET | Freq: Four times a day (QID) | ORAL | 1 refills | Status: DC | PRN

## 2024-03-31 MED ORDER — ONDANSETRON HCL 4 MG/2ML IJ SOLN
INTRAMUSCULAR | Status: AC
  Filled 2024-03-31: qty 2

## 2024-03-31 MED ORDER — ACETAMINOPHEN 500 MG PO TABS
1000.0000 mg | ORAL_TABLET | Freq: Once | ORAL | Status: AC
  Administered 2024-03-31: 1000 mg via ORAL

## 2024-03-31 MED ORDER — SILVER NITRATE-POT NITRATE 75-25 % EX MISC
CUTANEOUS | Status: AC
  Filled 2024-03-31: qty 10

## 2024-03-31 MED ORDER — DROPERIDOL 2.5 MG/ML IJ SOLN: 0.6250 mg | Freq: Once | INTRAMUSCULAR | Status: DC | PRN

## 2024-03-31 MED ORDER — OXYMETAZOLINE HCL 0.05 % NA SOLN
NASAL | Status: AC
  Filled 2024-03-31: qty 60

## 2024-03-31 MED ORDER — PROPOFOL 10 MG/ML IV BOLUS
INTRAVENOUS | Status: DC | PRN
  Administered 2024-03-31: 200 ug via INTRAVENOUS

## 2024-03-31 MED ORDER — LIDOCAINE 2% (20 MG/ML) 5 ML SYRINGE
INTRAMUSCULAR | Status: DC | PRN
  Administered 2024-03-31: 100 mg via INTRAVENOUS

## 2024-03-31 MED ORDER — LIDOCAINE-EPINEPHRINE 1 %-1:100000 IJ SOLN
INTRAMUSCULAR | Status: AC
  Filled 2024-03-31: qty 2

## 2024-03-31 MED ORDER — ONDANSETRON 4 MG PO TBDP: 4.0000 mg | ORAL_TABLET | Freq: Three times a day (TID) | ORAL | 0 refills | Status: DC | PRN

## 2024-03-31 MED ORDER — DEXAMETHASONE SODIUM PHOSPHATE 10 MG/ML IJ SOLN
INTRAMUSCULAR | Status: AC
  Filled 2024-03-31: qty 1

## 2024-03-31 MED ORDER — OXYCODONE HCL 5 MG PO TABS: 5.0000 mg | ORAL_TABLET | Freq: Once | ORAL | Status: DC | PRN

## 2024-03-31 MED ORDER — OXYCODONE HCL 5 MG PO TABS: 5.0000 mg | ORAL_TABLET | Freq: Four times a day (QID) | ORAL | 0 refills | Status: DC | PRN

## 2024-03-31 MED ORDER — LIDOCAINE 2% (20 MG/ML) 5 ML SYRINGE
INTRAMUSCULAR | Status: AC
  Filled 2024-03-31: qty 5

## 2024-03-31 MED ORDER — DEXAMETHASONE SODIUM PHOSPHATE 10 MG/ML IJ SOLN
INTRAMUSCULAR | Status: DC | PRN
  Administered 2024-03-31: 10 mg via INTRAVENOUS

## 2024-03-31 MED ORDER — MIDAZOLAM HCL 5 MG/5ML IJ SOLN
INTRAMUSCULAR | Status: DC | PRN
  Administered 2024-03-31: 2 mg via INTRAVENOUS

## 2024-03-31 MED ORDER — HYDROMORPHONE HCL 1 MG/ML IJ SOLN: 0.2500 mg | INTRAMUSCULAR | Status: DC | PRN

## 2024-03-31 MED ORDER — DEXMEDETOMIDINE HCL IN NACL 80 MCG/20ML IV SOLN
INTRAVENOUS | Status: DC | PRN
Start: 2024-03-31 — End: 2024-03-31
  Administered 2024-03-31: 12 ug via INTRAVENOUS
  Administered 2024-03-31: 8 ug via INTRAVENOUS

## 2024-03-31 MED ORDER — BACITRACIN ZINC 500 UNIT/GM EX OINT
TOPICAL_OINTMENT | CUTANEOUS | Status: AC
  Filled 2024-03-31: qty 28.35

## 2024-03-31 MED ORDER — FENTANYL CITRATE (PF) 100 MCG/2ML IJ SOLN
INTRAMUSCULAR | Status: DC | PRN
  Administered 2024-03-31 (×2): 50 ug via INTRAVENOUS

## 2024-03-31 MED ORDER — ROCURONIUM BROMIDE 10 MG/ML (PF) SYRINGE
PREFILLED_SYRINGE | INTRAVENOUS | Status: AC
  Filled 2024-03-31: qty 10

## 2024-03-31 MED ORDER — OXYCODONE HCL 5 MG/5ML PO SOLN: 5.0000 mg | Freq: Once | ORAL | Status: DC | PRN

## 2024-03-31 MED ORDER — LACTATED RINGERS IV SOLN: INTRAVENOUS | Status: DC

## 2024-03-31 MED ORDER — ROCURONIUM 10MG/ML (10ML) SYRINGE FOR MEDFUSION PUMP - OPTIME
INTRAVENOUS | Status: DC | PRN
  Administered 2024-03-31: 50 mg via INTRAVENOUS

## 2024-03-31 MED ORDER — MIDAZOLAM HCL 2 MG/2ML IJ SOLN
INTRAMUSCULAR | Status: AC
  Filled 2024-03-31: qty 2

## 2024-03-31 MED ORDER — IBUPROFEN 600 MG PO TABS
600.0000 mg | ORAL_TABLET | Freq: Four times a day (QID) | ORAL | 0 refills | Status: DC
Start: 2024-03-31 — End: 2024-08-12

## 2024-03-31 MED ORDER — ACETAMINOPHEN 10 MG/ML IV SOLN
INTRAVENOUS | Status: AC
  Filled 2024-03-31: qty 100

## 2024-03-31 MED ORDER — ONDANSETRON HCL 4 MG/2ML IJ SOLN
INTRAMUSCULAR | Status: DC | PRN
  Administered 2024-03-31: 4 mg via INTRAVENOUS

## 2024-03-31 MED ORDER — FENTANYL CITRATE (PF) 100 MCG/2ML IJ SOLN
INTRAMUSCULAR | Status: AC
  Filled 2024-03-31: qty 2

## 2024-03-31 MED ORDER — SUGAMMADEX SODIUM 200 MG/2ML IV SOLN
INTRAVENOUS | Status: DC | PRN
  Administered 2024-03-31: 200 mg via INTRAVENOUS

## 2024-03-31 MED ORDER — OXYMETAZOLINE HCL 0.05 % NA SOLN
NASAL | Status: DC | PRN
  Administered 2024-03-31: 1 via TOPICAL

## 2024-03-31 SURGICAL SUPPLY — 37 items
CANISTER SUCT 1200ML W/VALVE (MISCELLANEOUS) ×1 IMPLANT
CATH ROBINSON RED A/P 10FR (CATHETERS) IMPLANT
CATH ROBINSON RED A/P 12FR (CATHETERS) ×1 IMPLANT
CLEANER CAUTERY TIP PAD (MISCELLANEOUS) ×1 IMPLANT
COAGULATOR SUCT 8FR VV (MISCELLANEOUS) IMPLANT
COAGULATOR SUCT SWTCH 10FR 6 (ELECTROSURGICAL) ×1 IMPLANT
CORD BIPOLAR FORCEPS 12FT (ELECTRODE) IMPLANT
COVER BACK TABLE 60X90IN (DRAPES) ×1 IMPLANT
COVER MAYO STAND STRL (DRAPES) ×1 IMPLANT
DEFOGGER MIRROR 1QT (MISCELLANEOUS) ×1 IMPLANT
ELECT COATED BLADE 2.86 ST (ELECTRODE) ×1 IMPLANT
ELECTRODE REM PT RETRN 9FT PED (ELECTROSURGICAL) IMPLANT
ELECTRODE REM PT RTRN 9FT ADLT (ELECTROSURGICAL) IMPLANT
FORCEPS BIPOLAR SPETZLER 8 1.0 (NEUROSURGERY SUPPLIES) IMPLANT
GAUZE SPONGE 4X4 12PLY STRL LF (GAUZE/BANDAGES/DRESSINGS) ×2 IMPLANT
GLOVE BIO SURGEON STRL SZ 6 (GLOVE) ×1 IMPLANT
GLOVE BIO SURGEON STRL SZ 6.5 (GLOVE) IMPLANT
GLOVE BIOGEL PI IND STRL 6.5 (GLOVE) IMPLANT
GLOVE BIOGEL PI IND STRL 7.0 (GLOVE) IMPLANT
GLOVE SURG SS PI 7.0 STRL IVOR (GLOVE) IMPLANT
GOWN STRL REUS W/ TWL LRG LVL3 (GOWN DISPOSABLE) ×2 IMPLANT
HEMOSTAT ARISTA ABSORB 3G PWDR (HEMOSTASIS) IMPLANT
MANIFOLD NEPTUNE II (INSTRUMENTS) ×1 IMPLANT
MARKER SKIN DUAL TIP RULER LAB (MISCELLANEOUS) IMPLANT
NS IRRIG 1000ML POUR BTL (IV SOLUTION) ×1 IMPLANT
PENCIL SMOKE EVACUATOR (MISCELLANEOUS) ×1 IMPLANT
SHEET MEDIUM DRAPE 40X70 STRL (DRAPES) ×1 IMPLANT
SLEEVE SCD COMPRESS KNEE MED (STOCKING) IMPLANT
SPONGE TONSIL 1 RF SGL (DISPOSABLE) IMPLANT
SPONGE TONSIL 1.25 RF SGL STRG (GAUZE/BANDAGES/DRESSINGS) IMPLANT
SYR BULB EAR ULCER 3OZ GRN STR (SYRINGE) ×1 IMPLANT
TOWEL GREEN STERILE FF (TOWEL DISPOSABLE) ×1 IMPLANT
TUBE CONNECTING 20X1/4 (TUBING) ×2 IMPLANT
TUBE SALEM SUMP 12FR 48 (TUBING) IMPLANT
TUBE SALEM SUMP 16F (TUBING) IMPLANT
WAND COBLATOR 70 EVAC XTRA (SURGICAL WAND) IMPLANT
YANKAUER SUCT BULB TIP NO VENT (SUCTIONS) ×1 IMPLANT

## 2024-03-31 NOTE — H&P (Signed)
 Faith Bush is an 28 y.o. female.    Chief Complaint:  chronic tonsillitis   HPI: Patient presents today for planned elective procedure.  He/she denies any interval change in history since office visit on 01/06/24.   Past Medical History:  Diagnosis Date   Eczema    Medical history non-contributory     Past Surgical History:  Procedure Laterality Date   EXCISION OF KELOID N/A 07/28/2018   Procedure: EXCISION OF UMBILICAL MASS / CYST;  Surgeon: Shela Derby, MD;  Location: [facility] OR;  Service: General;  Laterality: N/A;   WISDOM TOOTH EXTRACTION      Family History  Problem Relation Age of Onset   Hypertension Father    Hypertension Maternal Grandmother    Lung cancer Maternal Grandfather    Diabetes Paternal Grandmother     Social History:  reports that she has never smoked. She has never used smokeless tobacco. She reports current alcohol use. She reports current drug use. Drug: Marijuana.  Allergies: No Known Allergies  Medications Prior to Admission  Medication Sig Dispense Refill   amoxicillin-clavulanate (AUGMENTIN) 875-125 MG tablet Take 1 tablet by mouth 2 (two) times daily. (Patient not taking: Reported on 12/09/2023)     ibuprofen  (ADVIL ,MOTRIN ) 200 MG tablet Take 400 mg by mouth every 8 (eight) hours as needed (for pain.).     Norgestimate-Ethinyl Estradiol Triphasic 0.18/0.215/0.25 MG-35 MCG tablet Take 1 tablet by mouth daily. (Patient not taking: Reported on 12/09/2023)      Results for orders placed or performed during the hospital encounter of 03/31/24 (from the past 48 hours)  Pregnancy, urine POC     Status: None   Collection Time: 03/31/24  6:30 AM  Result Value Ref Range   Preg Test, Ur NEGATIVE NEGATIVE    Comment:        THE SENSITIVITY OF THIS METHODOLOGY IS >24 mIU/mL    No results found.  ROS: Review of Systems  Constitutional:  Negative for chills and fever.  HENT:  Negative for ear pain.   Respiratory:  Negative for cough.    Cardiovascular:  Negative for chest pain.  Gastrointestinal:  Negative for nausea and vomiting.  Genitourinary:  Negative for dysuria.  Musculoskeletal:  Negative for neck pain.  Neurological:  Negative for headaches.  Psychiatric/Behavioral:  The patient does not have insomnia.     Height 5\' 6"  (1.676 m), weight 89.8 kg, last menstrual period 03/26/2024.  PHYSICAL EXAM: Physical Exam Constitutional:      Appearance: Normal appearance.  HENT:     Head: Normocephalic and atraumatic.     Nose: Nose normal.     Mouth/Throat:     Mouth: Mucous membranes are moist.  Eyes:     Pupils: Pupils are equal, round, and reactive to light.  Cardiovascular:     Rate and Rhythm: Normal rate.  Pulmonary:     Effort: Pulmonary effort is normal.  Musculoskeletal:        General: No swelling.  Skin:    General: Skin is warm and dry.  Neurological:     Mental Status: She is alert and oriented to person, place, and time.      Assessment/Plan Chronic tonsillitis  Tonsillar hypertrophy  - risks and benefits of surgery discussed and she would like to proceed with tonsillectomy    Cannon Arreola 03/31/2024, 6:40 AM

## 2024-03-31 NOTE — Progress Notes (Signed)
 Orders for post-op medications

## 2024-03-31 NOTE — Anesthesia Procedure Notes (Signed)
 Procedure Name: Intubation Date/Time: 03/31/2024 8:48 AM  Performed by: Darcel Early, CRNAPre-anesthesia Checklist: Patient identified, Emergency Drugs available, Suction available and Patient being monitored Patient Re-evaluated:Patient Re-evaluated prior to induction Oxygen Delivery Method: Circle system utilized Preoxygenation: Pre-oxygenation with 100% oxygen Induction Type: IV induction Ventilation: Mask ventilation without difficulty Laryngoscope Size: Mac and 3 Grade View: Grade I Tube type: Oral Rae Tube size: 7.0 mm Number of attempts: 1 Placement Confirmation: ETT inserted through vocal cords under direct vision, positive ETCO2 and breath sounds checked- equal and bilateral Secured at: 22 cm Tube secured with: Tape Dental Injury: Teeth and Oropharynx as per pre-operative assessment

## 2024-03-31 NOTE — Discharge Instructions (Addendum)
 Post-Operative Instructions (Tonsillectomy)  What to Expect: - It is common to have a sore throat for several days after surgery from the breathing tube. - You can eat and drink anything you normally do - there are no restrictions due to surgery alone.  If you have been recommended any other type of diet, such as an acid reflux diet, you should continue that.  You may want to eat light meals the day of anesthesia to make sure you don't get nauseated. - Take scheduled Tylenol  and Motrin  every 6 hrs staggered 3 hrs apart from each other. If your pain is still severe while doing that, and it will be the first 2-3 days, take Oxycodone  - It is very important that you stay well-hydrated and drink plenty of fluids during the recovery period.  Getting dehydrated tends to worsen post-operative pain. - After tonsillectomy, you should avoid any exercise or activity that gets your heart rate up for at least 10 days after surgery. - After tonsillectomy, you need to watch out for bleeding from the back of your mouth where the tonsils were; this can occur as a brief episode of bleeding that stops on its own, or as more serious or persistent bleeding.  If it is persistent, you should have someone drive you to the [facility] ER immediately and call the numbers above on the way to notify us .   - If the bleeding is brief and stops on its own, you can gargle cold water  and call my team at the numbers above for further instructions. - You should avoid lifting anything heavier than a gallon of milk for 2 weeks. - Call the office if you experience any of the following: Fever higher than 101 F Difficulty breathing or swallowing Bleeding that occurs suddenly or briskly and won't stop, or any other concerning bleeding Sharp increase in pain - note that with tonsillectomy, a sharp increase in pain is normal around day 5-7 after surgery. It is also normal for pain to be much worse in the mornings when you wake up. Nausea and  Vomiting  Take Zofran  if you experience nausea. It is important to take pain medications with some food in your stomach to avoid nausea and vomiting  You should contact Dr Adelia Adolphus office at [phone] if you need to be seen or if you experience any of the above symptoms.     Post Anesthesia Home Care Instructions  Activity: Get plenty of rest for the remainder of the day. A responsible adult should stay with you for 24 hours following the procedure.  For the next 24 hours, DO NOT: -Drive a car -Advertising copywriter -Drink alcoholic beverages -Take any medication unless instructed by your physician -Make any legal decisions or sign important papers.  Meals: Start with liquid foods such as gelatin or soup. Progress to regular foods as tolerated. Avoid greasy, spicy, heavy foods. If nausea and/or vomiting occur, drink only clear liquids until the nausea and/or vomiting subsides. Call your physician if vomiting continues.  Special Instructions/Symptoms: Your throat may feel dry or sore from the anesthesia or the breathing tube placed in your throat during surgery. If this causes discomfort, gargle with warm salt water . The discomfort should disappear within 24 hours.

## 2024-03-31 NOTE — Transfer of Care (Signed)
 Immediate Anesthesia Transfer of Care Note  Patient: Faith Bush  Procedure(s) Performed: TONSILLECTOMY (Bilateral: Throat)  Patient Location: PACU  Anesthesia Type:General  Level of Consciousness: awake, oriented, and patient cooperative  Airway & Oxygen Therapy: Patient Spontanous Breathing and Patient connected to face mask oxygen  Post-op Assessment: Report given to RN and Post -op Vital signs reviewed and stable  Post vital signs: Reviewed and stable  Last Vitals:  Vitals Value Taken Time  BP 131/97 03/31/24 0929  Temp 36.6 C 03/31/24 0929  Pulse 69 03/31/24 0929  Resp 10 03/31/24 0929  SpO2 100 % 03/31/24 0929  Vitals shown include unfiled device data.  Last Pain:  Vitals:   03/31/24 0640  TempSrc: Oral  PainSc: 0-No pain      Patients Stated Pain Goal: 6 (03/31/24 0640)  Complications: No notable events documented.

## 2024-03-31 NOTE — Anesthesia Preprocedure Evaluation (Addendum)
 Anesthesia Evaluation  Patient identified by MRN, date of birth, ID band Patient awake    Reviewed: Allergy & Precautions, NPO status , Patient's Chart, lab work & pertinent test results  Airway Mallampati: II  TM Distance: >3 FB Neck ROM: Full    Dental no notable dental hx.    Pulmonary neg pulmonary ROS, neg recent URI, Patient abstained from smoking.   Pulmonary exam normal breath sounds clear to auscultation       Cardiovascular negative cardio ROS Normal cardiovascular exam Rhythm:Regular Rate:Normal     Neuro/Psych negative neurological ROS     GI/Hepatic negative GI ROS,,,(+)     substance abuse  alcohol use and marijuana use  Endo/Other  negative endocrine ROS    Renal/GU negative Renal ROS     Musculoskeletal negative musculoskeletal ROS (+)    Abdominal   Peds  Hematology negative hematology ROS (+)   Anesthesia Other Findings   Reproductive/Obstetrics negative OB ROS                             Anesthesia Physical Anesthesia Plan  ASA: 2  Anesthesia Plan: General   Post-op Pain Management: Tylenol  PO (pre-op)*   Induction: Intravenous  PONV Risk Score and Plan: 3 and Ondansetron , Treatment may vary due to age or medical condition and Dexamethasone   Airway Management Planned: Oral ETT  Additional Equipment:   Intra-op Plan:   Post-operative Plan: Extubation in OR  Informed Consent: I have reviewed the patients History and Physical, chart, labs and discussed the procedure including the risks, benefits and alternatives for the proposed anesthesia with the patient or authorized representative who has indicated his/her understanding and acceptance.     Dental advisory given  Plan Discussed with: CRNA  Anesthesia Plan Comments:         Anesthesia Quick Evaluation

## 2024-03-31 NOTE — Op Note (Signed)
 OPERATIVE REPORT   Preoperative Diagnosis: Recurrent Tonsillitis, Tonsillar Hypertrophy   Postoperative Diagnosis: Recurrent Tonsillitis, Tonsillar Hypertrophy   Procedure: Tonsillectomy   Surgeon: Artice Last, MD   Assisstant: None   IVF: per anesthesia    Anesthesia: General     Preoperative Indication: A 28 year-old female has been seen in the office for recurrent episodes of tonsillitis refractory to medical management. There has been severe episodes that required ER visits, and missed work. Risks and Benefits of surgery were discussed with the patient including bleeding, anesthesia risk, trouble breathing, dehydration and sore throat.   Operative Procedure: The patient was correctly identified and brought to the operating room and placed in supine position. An endotracheal tube was placed without difficulty and the head of the bed was rotated 90 degrees with respect to anestheisa. A Crowe- Davis mouth gag was used to expose the oropharynx. A red rubber catheter was placed in the right naris to retract the soft palate. A curved Allis was used to grasp the right and left tonsils in an consecutive fashion. A Bovie was used to gently dissect the tonsils away from the surrounding muscular pillars. Hemostasis was obtained along the way. The mouth gag was let down for 3 minutes to evaluate for any bleeding. The oropharynx was copiously irrigated with saline and the stomach contents were suctioned. The patient was returned to anesthesia and awoken in stable condition.   The patient will be discharged on pain medications. A follow up will be scheduled in 3 weeks

## 2024-04-01 ENCOUNTER — Encounter (HOSPITAL_BASED_OUTPATIENT_CLINIC_OR_DEPARTMENT_OTHER): Payer: Self-pay | Admitting: Otolaryngology

## 2024-04-01 NOTE — Anesthesia Postprocedure Evaluation (Signed)
 Anesthesia Post Note  Patient: Faith Bush  Procedure(s) Performed: TONSILLECTOMY (Bilateral: Throat)     Patient location during evaluation: PACU Anesthesia Type: General Level of consciousness: sedated and patient cooperative Pain management: pain level controlled Vital Signs Assessment: post-procedure vital signs reviewed and stable Respiratory status: spontaneous breathing Cardiovascular status: stable Anesthetic complications: no   No notable events documented.  Last Vitals:  Vitals:   03/31/24 1004 03/31/24 1019  BP:  (!) 141/98  Pulse:  67  Resp: 11 16  Temp: 36.7 C 36.7 C  SpO2:  100%    Last Pain:  Vitals:   03/31/24 0640  TempSrc: Oral                 Gorman Laughter

## 2024-04-24 ENCOUNTER — Ambulatory Visit (INDEPENDENT_AMBULATORY_CARE_PROVIDER_SITE_OTHER): Payer: BC Managed Care – PPO | Admitting: Physician Assistant

## 2024-04-24 VITALS — BP 135/96 | HR 90

## 2024-04-24 DIAGNOSIS — Z9889 Other specified postprocedural states: Secondary | ICD-10-CM

## 2024-04-24 DIAGNOSIS — Z9089 Acquired absence of other organs: Secondary | ICD-10-CM

## 2024-04-24 NOTE — Progress Notes (Signed)
 Dear Dr. Rogerio Clay, Here is my assessment for our mutual patient, Faith Bush. Thank you for allowing me the opportunity to care for your patient. Please do not hesitate to contact me should you have any other questions. Sincerely, Belma Boxer PA-C  Otolaryngology Clinic Note Referring provider: Dr. Rogerio Clay HPI:  Faith Bush is a 28 y.o. female kindly referred by Dr. Rogerio Clay   The patient is a 28 year old female seen in our office for postop follow-up status post tonsillectomy on 03/31/2024 by Dr. Soldatova.  The patient had a history of recurrent tonsil stones as well as tonsillitis.  Status post surgery she notes she is doing well, she had some initial pain that was not severe, she denies any bleeding, difficulty swallowing, changes in voice, or any concerns today.       Independent Review of Additional Tests or Records:   none  PMH/Meds/All/SocHx/FamHx/ROS:   Past Medical History:  Diagnosis Date   Eczema    Medical history non-contributory      Past Surgical History:  Procedure Laterality Date   EXCISION OF KELOID N/A 07/28/2018   Procedure: EXCISION OF UMBILICAL MASS / CYST;  Surgeon: Shela Derby, MD;  Location: [facility] OR;  Service: General;  Laterality: N/A;   TONSILLECTOMY Bilateral 03/31/2024   Procedure: TONSILLECTOMY;  Surgeon: Artice Last, MD;  Location: [facility];  Service: ENT;  Laterality: Bilateral;   WISDOM TOOTH EXTRACTION      Family History  Problem Relation Age of Onset   Hypertension Father    Hypertension Maternal Grandmother    Lung cancer Maternal Grandfather    Diabetes Paternal Grandmother      Social Connections: Unknown (10/31/2023)   Social Connection and Isolation Panel [NHANES]    Frequency of Communication with Friends and Family: Twice a week    Frequency of Social Gatherings with Friends and Family: Patient declined    Attends Religious Services: Patient declined    Database administrator or Organizations: No     Attends Engineer, structural: Not on file    Marital Status: Patient declined      Current Outpatient Medications:    acetaminophen  (TYLENOL ) 500 MG tablet, Take 1 tablet (500 mg total) by mouth every 6 (six) hours as needed. Please take every 6 hrs and stagger with Motrin  3 hrs apart from each other. Please do not exceed maximum daily dose to avoid liver damage, Disp: 30 tablet, Rfl: 1   ibuprofen  (ADVIL ) 600 MG tablet, Take 1 tablet (600 mg total) by mouth every 6 (six) hours. Please take every 6 hrs, and stagger this medication 3 hrs apart from Tylenol , Disp: 30 tablet, Rfl: 0   ondansetron  (ZOFRAN -ODT) 4 MG disintegrating tablet, Take 1 tablet (4 mg total) by mouth every 8 (eight) hours as needed for nausea or vomiting., Disp: 5 tablet, Rfl: 0   oxyCODONE  (ROXICODONE ) 5 MG immediate release tablet, Take 1 tablet (5 mg total) by mouth every 6 (six) hours as needed for severe pain (pain score 7-10) (for pain after tonsillectomy not controlled with Tylenol  and Motrin )., Disp: 30 tablet, Rfl: 0   Norgestimate-Ethinyl Estradiol Triphasic 0.18/0.215/0.25 MG-35 MCG tablet, Take 1 tablet by mouth daily. (Patient not taking: Reported on 04/24/2024), Disp: , Rfl:    Physical Exam:   BP (!) 135/96   Pulse 90   LMP 03/26/2024 (Exact Date)   SpO2 (!) 78%   Pertinent Findings  CN II-XII intact Bilateral tonsillar pillars with granulation tissue, no surrounding redness or swelling,  no bleeding, uvula midline and rises with phonation, tolerating secretions voice normal, patient within normal limits  Neck is supple with full active range of motion, no palpable masses No respiratory distress or stridor  Seprately Identifiable Procedures:  None  Impression & Plans:  Faith Bush is a 28 y.o. female with the following   Status post tonsillectomy-  Postop follow-up for tonsillectomy.  She is doing very well today with no concerns or issues.  No postop complications.  She does not require  any further evaluation or management, she may follow-up on a as needed basis.  She was given strict return precautions.  She verbalized understanding and agreement to today's plan.   - f/u PRN   Thank you for allowing me the opportunity to care for your patient. Please do not hesitate to contact me should you have any other questions.  Sincerely, Belma Boxer PA-C [facility] Phone: [phone] Fax: [phone]  04/24/2024, 3:06 PM

## 2024-08-06 ENCOUNTER — Other Ambulatory Visit: Payer: Self-pay

## 2024-08-06 ENCOUNTER — Emergency Department (HOSPITAL_COMMUNITY)

## 2024-08-06 ENCOUNTER — Emergency Department (HOSPITAL_COMMUNITY)
Admission: EM | Admit: 2024-08-06 | Discharge: 2024-08-06 | Disposition: A | Discharge status: Home / Self Care | Attending: Emergency Medicine | Admitting: Emergency Medicine

## 2024-08-06 ENCOUNTER — Encounter (HOSPITAL_COMMUNITY): Payer: Self-pay | Admitting: Emergency Medicine

## 2024-08-06 DIAGNOSIS — N839 Noninflammatory disorder of ovary, fallopian tube and broad ligament, unspecified: Secondary | ICD-10-CM | POA: Diagnosis not present

## 2024-08-06 DIAGNOSIS — R109 Unspecified abdominal pain: Secondary | ICD-10-CM | POA: Diagnosis not present

## 2024-08-06 DIAGNOSIS — N858 Other specified noninflammatory disorders of uterus: Secondary | ICD-10-CM | POA: Diagnosis not present

## 2024-08-06 DIAGNOSIS — K429 Umbilical hernia without obstruction or gangrene: Secondary | ICD-10-CM | POA: Diagnosis not present

## 2024-08-06 DIAGNOSIS — R10814 Left lower quadrant abdominal tenderness: Secondary | ICD-10-CM | POA: Diagnosis not present

## 2024-08-06 DIAGNOSIS — N83202 Unspecified ovarian cyst, left side: Secondary | ICD-10-CM | POA: Diagnosis not present

## 2024-08-06 DIAGNOSIS — R1032 Left lower quadrant pain: Secondary | ICD-10-CM | POA: Diagnosis not present

## 2024-08-06 DIAGNOSIS — N838 Other noninflammatory disorders of ovary, fallopian tube and broad ligament: Secondary | ICD-10-CM | POA: Diagnosis not present

## 2024-08-06 LAB — I-STAT CG4 LACTIC ACID, ED
Lactic Acid, Venous: 2.7 mmol/L (ref 0.5–1.9)
Lactic Acid, Venous: 1.3 mmol/L (ref 0.5–1.9)

## 2024-08-06 LAB — URINALYSIS, ROUTINE W REFLEX MICROSCOPIC
Bilirubin Urine: NEGATIVE
Glucose, UA: NEGATIVE mg/dL
Ketones, ur: 5 mg/dL — AB
Leukocytes,Ua: NEGATIVE
Nitrite: NEGATIVE
Protein, ur: NEGATIVE mg/dL
Specific Gravity, Urine: 1.006 (ref 1.005–1.030)
pH: 7 (ref 5.0–8.0)

## 2024-08-06 LAB — CBC
HCT: 41 % (ref 36.0–46.0)
Hemoglobin: 14 g/dL (ref 12.0–15.0)
MCH: 29 pg (ref 26.0–34.0)
MCHC: 34.1 g/dL (ref 30.0–36.0)
MCV: 84.9 fL (ref 80.0–100.0)
Platelets: 275 K/uL (ref 150–400)
RBC: 4.83 MIL/uL (ref 3.87–5.11)
RDW: 12.5 % (ref 11.5–15.5)
WBC: 8 K/uL (ref 4.0–10.5)
nRBC: 0 % (ref 0.0–0.2)

## 2024-08-06 LAB — COMPREHENSIVE METABOLIC PANEL WITH GFR
ALT: 11 U/L (ref 0–44)
AST: 21 U/L (ref 15–41)
Albumin: 4.6 g/dL (ref 3.5–5.0)
Alkaline Phosphatase: 73 U/L (ref 38–126)
Anion gap: 20 — ABNORMAL HIGH (ref 5–15)
BUN: 9 mg/dL (ref 6–20)
CO2: 17 mmol/L — ABNORMAL LOW (ref 22–32)
Calcium: 9.5 mg/dL (ref 8.9–10.3)
Chloride: 99 mmol/L (ref 98–111)
Creatinine, Ser: 0.74 mg/dL (ref 0.44–1.00)
GFR, Estimated: >60 mL/min (ref >60)
Glucose, Bld: 152 mg/dL — ABNORMAL HIGH (ref 70–99)
Potassium: 3.4 mmol/L — ABNORMAL LOW (ref 3.5–5.1)
Sodium: 136 mmol/L (ref 135–145)
Total Bilirubin: 0.7 mg/dL (ref 0.0–1.2)
Total Protein: 7.7 g/dL (ref 6.5–8.1)

## 2024-08-06 LAB — LIPASE, BLOOD: Lipase: 28 U/L (ref 11–51)

## 2024-08-06 LAB — HCG, SERUM, QUALITATIVE: Preg, Serum: NEGATIVE

## 2024-08-06 MED ORDER — MORPHINE SULFATE (PF) 4 MG/ML IV SOLN
4.0000 mg | Freq: Once | INTRAVENOUS | Status: AC
  Administered 2024-08-06: 4 mg via INTRAVENOUS
  Filled 2024-08-06: qty 1

## 2024-08-06 MED ORDER — SODIUM CHLORIDE 0.9 % IV BOLUS
1000.0000 mL | Freq: Once | INTRAVENOUS | Status: AC
  Administered 2024-08-06: 1000 mL via INTRAVENOUS

## 2024-08-06 MED ORDER — MORPHINE SULFATE 15 MG PO TABS: 7.5000 mg | ORAL_TABLET | ORAL | 0 refills | Status: DC | PRN

## 2024-08-06 MED ORDER — ONDANSETRON 4 MG PO TBDP: ORAL_TABLET | ORAL | 0 refills | Status: DC

## 2024-08-06 MED ORDER — ONDANSETRON HCL 4 MG/2ML IJ SOLN
4.0000 mg | Freq: Once | INTRAMUSCULAR | Status: AC
  Administered 2024-08-06: 4 mg via INTRAVENOUS
  Filled 2024-08-06: qty 2

## 2024-08-06 NOTE — ED Triage Notes (Incomplete)
 Patient c/o LLQ abdominal pain x 1 hour. Patient report nausea denies vomiting.

## 2024-08-06 NOTE — Discharge Instructions (Addendum)
 Please return for sudden worsening pain.  I discussed case with OB/GYN here.  Recommends that you follow-up with your OB/GYN clinic within the week.  I would call them tomorrow to try and set up an appointment.  Take 4 over the counter ibuprofen  tablets 3 times a day or 2 over-the-counter naproxen tablets twice a day for pain. Also take tylenol  1000mg (2 extra strength) four times a day.   Then take the pain medicine if you feel like you need it. Narcotics do not help with the pain, they only make you care about it less.  You can become addicted to this, people may break into your house to steal it.  It will constipate you.  If you drive under the influence of this medicine you can get a DUI.

## 2024-08-06 NOTE — ED Provider Notes (Signed)
 [place] EMERGENCY DEPARTMENT AT [facility] Provider Note   CSN: [id number] Arrival date & time: 08/06/24  1438     Patient presents with: Abdominal Pain   Faith Bush is a 28 y.o. female.   28 yo F with a chief complaints of left lower quadrant abdominal discomfort.  This started suddenly a few hours ago.  Nothing seems to make it better or worse.  Had 1 episode of emesis while waiting to be seen.  No fevers.  Has never had a thing like this before.   Abdominal Pain      Prior to Admission medications   Medication Sig Start Date End Date Taking? Authorizing Provider  morphine  (MSIR) 15 MG tablet Take 0.5 tablets (7.5 mg total) by mouth every 4 (four) hours as needed for severe pain (pain score 7-10). 08/06/24  Yes Emil Share, DO  ondansetron  (ZOFRAN -ODT) 4 MG disintegrating tablet 4mg  ODT q4 hours prn nausea/vomit 08/06/24  Yes Guinn Delarosa, DO  acetaminophen  (TYLENOL ) 500 MG tablet Take 1 tablet (500 mg total) by mouth every 6 (six) hours as needed. Please take every 6 hrs and stagger with Motrin  3 hrs apart from each other. Please do not exceed maximum daily dose to avoid liver damage 03/31/24   Soldatova, Liuba, MD  ibuprofen  (ADVIL ) 600 MG tablet Take 1 tablet (600 mg total) by mouth every 6 (six) hours. Please take every 6 hrs, and stagger this medication 3 hrs apart from Tylenol  03/31/24   Soldatova, Liuba, MD  Norgestimate-Ethinyl Estradiol Triphasic 0.18/0.215/0.25 MG-35 MCG tablet Take 1 tablet by mouth daily. Patient not taking: Reported on 04/24/2024 11/28/17   [provider]  oxyCODONE  (ROXICODONE ) 5 MG immediate release tablet Take 1 tablet (5 mg total) by mouth every 6 (six) hours as needed for severe pain (pain score 7-10) (for pain after tonsillectomy not controlled with Tylenol  and Motrin ). 03/31/24   Soldatova, Liuba, MD    Allergies: Patient has no known allergies.    Review of Systems  Gastrointestinal:  Positive for abdominal pain.     Updated Vital Signs BP (!) 153/95 (BP Location: Left Arm)   Pulse (!) 58   Temp 99.1 F (37.3 C) (Oral)   Resp 14   SpO2 100%   Physical Exam Vitals and nursing note reviewed.  Constitutional:      General: She is not in acute distress.    Appearance: She is well-developed. She is not diaphoretic.  HENT:     Head: Normocephalic and atraumatic.  Eyes:     Pupils: Pupils are equal, round, and reactive to light.  Cardiovascular:     Rate and Rhythm: Normal rate and regular rhythm.     Heart sounds: No murmur heard.    No friction rub. No gallop.  Pulmonary:     Effort: Pulmonary effort is normal.     Breath sounds: No wheezing or rales.  Abdominal:     General: There is no distension.     Palpations: Abdomen is soft.     Tenderness: There is abdominal tenderness.     Comments: Diffuse tenderness to the left lower quadrant.    Musculoskeletal:        General: No tenderness.     Cervical back: Normal range of motion and neck supple.  Skin:    General: Skin is warm and dry.  Neurological:     Mental Status: She is alert and oriented to person, place, and time.  Psychiatric:  Behavior: Behavior normal.     (all labs ordered are listed, but only abnormal results are displayed) Labs Reviewed  COMPREHENSIVE METABOLIC PANEL WITH GFR - Abnormal; Notable for the following components:      Result Value   Potassium 3.4 (*)    CO2 17 (*)    Glucose, Bld 152 (*)    Anion gap 20 (*)    All other components within normal limits  URINALYSIS, ROUTINE W REFLEX MICROSCOPIC - Abnormal; Notable for the following components:   APPearance HAZY (*)    Hgb urine dipstick LARGE (*)    Ketones, ur 5 (*)    Bacteria, UA RARE (*)    All other components within normal limits  I-STAT CG4 LACTIC ACID, ED - Abnormal; Notable for the following components:   Lactic Acid, Venous 2.7 (*)    All other components within normal limits  LIPASE, BLOOD  CBC  HCG, SERUM, QUALITATIVE   I-STAT CG4 LACTIC ACID, ED    EKG: None  Radiology: US  Pelvis Complete Result Date: 08/06/2024 CLINICAL DATA:  torsion EXAM: TRANSABDOMINAL AND TRANSVAGINAL ULTRASOUND OF PELVIS DOPPLER ULTRASOUND OF OVARIES TECHNIQUE: Both transabdominal and transvaginal ultrasound examinations of the pelvis were performed. Transabdominal technique was performed for global imaging of the pelvis including uterus, ovaries, adnexal regions, and pelvic cul-de-sac. It was necessary to proceed with endovaginal exam following the transabdominal exam to visualize the ovaries and endometrium. Color and duplex Doppler ultrasound was utilized to evaluate blood flow to the ovaries. COMPARISON:  CT renal 08/06/2024 FINDINGS: Uterus Measurements: 8.7 x 3.9 x 4.6 cm = volume: 42 mL. No fibroids or other mass visualized. Endometrium Thickness: 11 mm.  No focal abnormality visualized. Right ovary Not visualized. Left ovary Measurements: 4.7 x 2.8 x 2.9 cm = volume: 20 mL. Redemonstration of a complex 10 cm left ovarian mass. Pulsed Doppler evaluation of the left ovary demonstrates normal low-resistance arterial and venous waveforms. Other findings Trace free fluid. Please note as per images on CT, the large left ovarian mass crosses midline anterior to the uterus. The right ovary is to the right of the uterus posterior to this. IMPRESSION: 1. Redemonstration of a complex 10 cm left ovarian mass. No findings suggest left ovarian torsion; however, please note that this is not fully excluded as torsion may be intermittent. Recommend gynecologic consultation. 2. The right ovary is not visualized but appears normal on the CT 08/06/2024. 3. Please note as per images on CT, the left large ovarian mass crosses midline anterior to the uterus. The right ovary is to the right of the uterus posterior to this large mass. Electronically Signed   By: Morgane  Naveau M.D.   On: 08/06/2024 22:08   US  Transvaginal Non-OB Result Date: 08/06/2024 CLINICAL  DATA:  torsion EXAM: TRANSABDOMINAL AND TRANSVAGINAL ULTRASOUND OF PELVIS DOPPLER ULTRASOUND OF OVARIES TECHNIQUE: Both transabdominal and transvaginal ultrasound examinations of the pelvis were performed. Transabdominal technique was performed for global imaging of the pelvis including uterus, ovaries, adnexal regions, and pelvic cul-de-sac. It was necessary to proceed with endovaginal exam following the transabdominal exam to visualize the ovaries and endometrium. Color and duplex Doppler ultrasound was utilized to evaluate blood flow to the ovaries. COMPARISON:  CT renal 08/06/2024 FINDINGS: Uterus Measurements: 8.7 x 3.9 x 4.6 cm = volume: 42 mL. No fibroids or other mass visualized. Endometrium Thickness: 11 mm.  No focal abnormality visualized. Right ovary Not visualized. Left ovary Measurements: 4.7 x 2.8 x 2.9 cm = volume: 20 mL. Redemonstration  of a complex 10 cm left ovarian mass. Pulsed Doppler evaluation of the left ovary demonstrates normal low-resistance arterial and venous waveforms. Other findings Trace free fluid. Please note as per images on CT, the large left ovarian mass crosses midline anterior to the uterus. The right ovary is to the right of the uterus posterior to this. IMPRESSION: 1. Redemonstration of a complex 10 cm left ovarian mass. No findings suggest left ovarian torsion; however, please note that this is not fully excluded as torsion may be intermittent. Recommend gynecologic consultation. 2. The right ovary is not visualized but appears normal on the CT 08/06/2024. 3. Please note as per images on CT, the left large ovarian mass crosses midline anterior to the uterus. The right ovary is to the right of the uterus posterior to this large mass. Electronically Signed   By: Morgane  Naveau M.D.   On: 08/06/2024 22:08   US  Art/Ven Flow Abd Pelv Doppler Result Date: 08/06/2024 CLINICAL DATA:  torsion EXAM: TRANSABDOMINAL AND TRANSVAGINAL ULTRASOUND OF PELVIS DOPPLER ULTRASOUND OF OVARIES  TECHNIQUE: Both transabdominal and transvaginal ultrasound examinations of the pelvis were performed. Transabdominal technique was performed for global imaging of the pelvis including uterus, ovaries, adnexal regions, and pelvic cul-de-sac. It was necessary to proceed with endovaginal exam following the transabdominal exam to visualize the ovaries and endometrium. Color and duplex Doppler ultrasound was utilized to evaluate blood flow to the ovaries. COMPARISON:  CT renal 08/06/2024 FINDINGS: Uterus Measurements: 8.7 x 3.9 x 4.6 cm = volume: 42 mL. No fibroids or other mass visualized. Endometrium Thickness: 11 mm.  No focal abnormality visualized. Right ovary Not visualized. Left ovary Measurements: 4.7 x 2.8 x 2.9 cm = volume: 20 mL. Redemonstration of a complex 10 cm left ovarian mass. Pulsed Doppler evaluation of the left ovary demonstrates normal low-resistance arterial and venous waveforms. Other findings Trace free fluid. Please note as per images on CT, the large left ovarian mass crosses midline anterior to the uterus. The right ovary is to the right of the uterus posterior to this. IMPRESSION: 1. Redemonstration of a complex 10 cm left ovarian mass. No findings suggest left ovarian torsion; however, please note that this is not fully excluded as torsion may be intermittent. Recommend gynecologic consultation. 2. The right ovary is not visualized but appears normal on the CT 08/06/2024. 3. Please note as per images on CT, the left large ovarian mass crosses midline anterior to the uterus. The right ovary is to the right of the uterus posterior to this large mass. Electronically Signed   By: Morgane  Naveau M.D.   On: 08/06/2024 22:08   CT Renal Stone Study Result Date: 08/06/2024 CLINICAL DATA:  Abdominal/flank pain, stone suspected EXAM: CT ABDOMEN AND PELVIS WITHOUT CONTRAST TECHNIQUE: Multidetector CT imaging of the abdomen and pelvis was performed following the standard protocol without IV contrast.  RADIATION DOSE REDUCTION: This exam was performed according to the departmental dose-optimization program which includes automated exposure control, adjustment of the mA and/or kV according to patient size and/or use of iterative reconstruction technique. COMPARISON:  Ultrasound abdomen 05/27/2018 FINDINGS: Lower chest: No acute abnormality. Hepatobiliary: No focal liver abnormality. No gallstones, gallbladder wall thickening, or pericholecystic fluid. No biliary dilatation. Pancreas: No focal lesion. Normal pancreatic contour. No surrounding inflammatory changes. No main pancreatic ductal dilatation. Spleen: Normal in size without focal abnormality. Adrenals/Urinary Tract: No adrenal nodule bilaterally. No nephrolithiasis and no hydronephrosis. No definite contour-deforming renal mass. No ureterolithiasis or hydroureter. The urinary bladder is unremarkable. Stomach/Bowel:  Stomach is within normal limits. No evidence of bowel wall thickening or dilatation. Appendix appears normal. Vascular/Lymphatic: No abdominal aorta or iliac aneurysm. No abdominal, pelvic, or inguinal lymphadenopathy. Reproductive: Left ovarian 10.2 x 7.5 cm complex cystic and solid lesion (2:48-63). Uterus is unremarkable. The right ovary is unremarkable. Other: No intraperitoneal free fluid. No intraperitoneal free gas. No organized fluid collection. Musculoskeletal: Tiny fat containing umbilical hernia. No suspicious lytic or blastic osseous lesions. No acute displaced fracture. IMPRESSION: Complex left ovarian 10.2 x 7.5 cm cystic and solid lesion. Markedly limited evaluation on this noncontrast study. Consider pelvic ultrasound for further evaluation of possible ovarian torsion which may be difficult given size of mass. Given size, recommend outpatient MRI with contrast for further evaluation. Electronically Signed   By: Morgane  Naveau M.D.   On: 08/06/2024 20:08     Procedures   Medications Ordered in the ED  morphine  (PF) 4 MG/ML  injection 4 mg (4 mg Intravenous Given 08/06/24 1735)  ondansetron  (ZOFRAN ) injection 4 mg (4 mg Intravenous Given 08/06/24 1735)  sodium chloride  0.9 % bolus 1,000 mL (0 mLs Intravenous Stopped 08/06/24 1834)  morphine  (PF) 4 MG/ML injection 4 mg (4 mg Intravenous Given 08/06/24 2048)                                    Medical Decision Making Amount and/or Complexity of Data Reviewed Labs: ordered. Radiology: ordered.  Risk Prescription drug management.   28 yo F with a chief complaint of sudden onset left lower quadrant abdominal discomfort.  Started just prior to arrival.  Will treat symptoms.  Bolus of IV fluids.  CT imaging.  Patient with a metabolic acidosis and anion gap.  Patient with large left ovarian mass.  Will obtain ultrasound.  Patient reassessed and feels better.  Ultrasound without torsion.  Discussed case with Dr. Cleatus, GYN.  She independently reviewed the patient's images.  Recommended close follow-up with her GYN in the clinic.  10:24 PM:  I have discussed the diagnosis/risks/treatment options with the patient and family.  Evaluation and diagnostic testing in the emergency department does not suggest an emergent condition requiring admission or immediate intervention beyond what has been performed at this time.  They will follow up with GYN. We also discussed returning to the ED immediately if new or worsening sx occur. We discussed the sx which are most concerning (e.g., sudden worsening pain, fever, inability to tolerate by mouth) that necessitate immediate return. Medications administered to the patient during their visit and any new prescriptions provided to the patient are listed below.  Medications given during this visit Medications  morphine  (PF) 4 MG/ML injection 4 mg (4 mg Intravenous Given 08/06/24 1735)  ondansetron  (ZOFRAN ) injection 4 mg (4 mg Intravenous Given 08/06/24 1735)  sodium chloride  0.9 % bolus 1,000 mL (0 mLs Intravenous Stopped 08/06/24  1834)  morphine  (PF) 4 MG/ML injection 4 mg (4 mg Intravenous Given 08/06/24 2048)     The patient appears reasonably screen and/or stabilized for discharge and I doubt any other medical condition or other [facility] requiring further screening, evaluation, or treatment in the ED at this time prior to discharge.       Final diagnoses:  Ovarian mass, left    ED Discharge Orders          Ordered    morphine  (MSIR) 15 MG tablet  Every 4 hours PRN  08/06/24 2220    ondansetron  (ZOFRAN -ODT) 4 MG disintegrating tablet        08/06/24 2220               Emil Share, DO 08/06/24 2224

## 2024-08-12 ENCOUNTER — Other Ambulatory Visit: Payer: Self-pay | Admitting: Nurse Practitioner

## 2024-08-12 ENCOUNTER — Other Ambulatory Visit (INDEPENDENT_AMBULATORY_CARE_PROVIDER_SITE_OTHER)

## 2024-08-12 ENCOUNTER — Encounter (HOSPITAL_COMMUNITY)
Admission: AD | Disposition: A | Discharge status: Home / Self Care | Payer: Self-pay | Source: Ambulatory Visit | Attending: Obstetrics and Gynecology

## 2024-08-12 ENCOUNTER — Ambulatory Visit (HOSPITAL_COMMUNITY): Admitting: Anesthesiology

## 2024-08-12 ENCOUNTER — Ambulatory Visit (HOSPITAL_COMMUNITY)
Admission: AD | Admit: 2024-08-12 | Discharge: 2024-08-12 | Disposition: A | Discharge status: Home / Self Care | Source: Ambulatory Visit | Attending: Obstetrics and Gynecology | Admitting: Obstetrics and Gynecology

## 2024-08-12 ENCOUNTER — Other Ambulatory Visit (HOSPITAL_COMMUNITY): Payer: Self-pay

## 2024-08-12 ENCOUNTER — Other Ambulatory Visit: Payer: Self-pay

## 2024-08-12 ENCOUNTER — Encounter (HOSPITAL_COMMUNITY): Payer: Self-pay | Admitting: Obstetrics and Gynecology

## 2024-08-12 ENCOUNTER — Encounter: Payer: Self-pay | Admitting: Nurse Practitioner

## 2024-08-12 ENCOUNTER — Ambulatory Visit: Admitting: Nurse Practitioner

## 2024-08-12 ENCOUNTER — Telehealth: Payer: Self-pay

## 2024-08-12 VITALS — BP 102/78 | HR 98 | Temp 99.2°F | Wt 196.0 lb

## 2024-08-12 DIAGNOSIS — N83202 Unspecified ovarian cyst, left side: Secondary | ICD-10-CM | POA: Insufficient documentation

## 2024-08-12 DIAGNOSIS — N838 Other noninflammatory disorders of ovary, fallopian tube and broad ligament: Secondary | ICD-10-CM

## 2024-08-12 DIAGNOSIS — N83512 Torsion of left ovary and ovarian pedicle: Secondary | ICD-10-CM | POA: Insufficient documentation

## 2024-08-12 DIAGNOSIS — K660 Peritoneal adhesions (postprocedural) (postinfection): Secondary | ICD-10-CM | POA: Diagnosis not present

## 2024-08-12 DIAGNOSIS — N83519 Torsion of ovary and ovarian pedicle, unspecified side: Secondary | ICD-10-CM

## 2024-08-12 DIAGNOSIS — N8353 Torsion of ovary, ovarian pedicle and fallopian tube: Secondary | ICD-10-CM | POA: Diagnosis not present

## 2024-08-12 DIAGNOSIS — Z6831 Body mass index (BMI) 31.0-31.9, adult: Secondary | ICD-10-CM | POA: Diagnosis not present

## 2024-08-12 DIAGNOSIS — E66813 Obesity, class 3: Secondary | ICD-10-CM | POA: Insufficient documentation

## 2024-08-12 HISTORY — PX: LAPAROSCOPY: SHX197

## 2024-08-12 HISTORY — PX: LAPAROSCOPIC SALPINGO OOPHERECTOMY: SHX5927

## 2024-08-12 LAB — TYPE AND SCREEN
ABO/RH(D): A POS
Antibody Screen: NEGATIVE

## 2024-08-12 LAB — ABO/RH: ABO/RH(D): A POS

## 2024-08-12 LAB — POCT PREGNANCY, URINE: Preg Test, Ur: NEGATIVE

## 2024-08-12 SURGERY — LAPAROSCOPY, DIAGNOSTIC
Anesthesia: General | Site: Uterus

## 2024-08-12 MED ORDER — 0.9 % SODIUM CHLORIDE (POUR BTL) OPTIME
TOPICAL | Status: DC | PRN
Start: 2024-08-12 — End: 2024-08-12
  Administered 2024-08-12: 1000 mL

## 2024-08-12 MED ORDER — OXYCODONE HCL 5 MG PO TABS
5.0000 mg | ORAL_TABLET | Freq: Four times a day (QID) | ORAL | 0 refills | Status: AC | PRN
  Filled 2024-08-12: qty 15, 4d supply, fill #0

## 2024-08-12 MED ORDER — SODIUM CHLORIDE 0.9 % IV SOLN
INTRAVENOUS | Status: DC
Start: 2024-08-12 — End: 2024-08-12

## 2024-08-12 MED ORDER — OXYCODONE HCL 5 MG/5ML PO SOLN: 5.0000 mg | Freq: Once | ORAL | Status: AC | PRN

## 2024-08-12 MED ORDER — IBUPROFEN 600 MG PO TABS
600.0000 mg | ORAL_TABLET | Freq: Four times a day (QID) | ORAL | 1 refills | Status: AC
  Filled 2024-08-12: qty 60, 15d supply, fill #0

## 2024-08-12 MED ORDER — POVIDONE-IODINE 10 % EX SWAB
2.0000 | Freq: Once | CUTANEOUS | Status: AC
  Administered 2024-08-12: 2 via TOPICAL

## 2024-08-12 MED ORDER — KETOROLAC TROMETHAMINE 30 MG/ML IJ SOLN
INTRAMUSCULAR | Status: AC
  Filled 2024-08-12: qty 1

## 2024-08-12 MED ORDER — DEXMEDETOMIDINE HCL IN NACL 80 MCG/20ML IV SOLN
INTRAVENOUS | Status: DC | PRN
Start: 2024-08-12 — End: 2024-08-12
  Administered 2024-08-12: 10 ug via INTRAVENOUS

## 2024-08-12 MED ORDER — CHLORHEXIDINE GLUCONATE 0.12 % MT SOLN
15.0000 mL | Freq: Once | OROMUCOSAL | Status: AC
  Administered 2024-08-12: 15 mL via OROMUCOSAL
  Filled 2024-08-12: qty 15

## 2024-08-12 MED ORDER — BUPIVACAINE HCL (PF) 0.5 % IJ SOLN
INTRAMUSCULAR | Status: AC
  Filled 2024-08-12: qty 30

## 2024-08-12 MED ORDER — OXYCODONE HCL 5 MG PO TABS
ORAL_TABLET | ORAL | Status: AC
  Filled 2024-08-12: qty 1

## 2024-08-12 MED ORDER — ROCURONIUM BROMIDE 10 MG/ML (PF) SYRINGE
PREFILLED_SYRINGE | INTRAVENOUS | Status: AC
  Filled 2024-08-12: qty 10

## 2024-08-12 MED ORDER — LACTATED RINGERS IV SOLN: INTRAVENOUS | Status: DC

## 2024-08-12 MED ORDER — ORAL CARE MOUTH RINSE: 15.0000 mL | Freq: Once | OROMUCOSAL | Status: AC

## 2024-08-12 MED ORDER — ROCURONIUM BROMIDE 10 MG/ML (PF) SYRINGE
PREFILLED_SYRINGE | INTRAVENOUS | Status: DC | PRN
  Administered 2024-08-12: 20 mg via INTRAVENOUS
  Administered 2024-08-12: 50 mg via INTRAVENOUS

## 2024-08-12 MED ORDER — ONDANSETRON HCL 4 MG/2ML IJ SOLN: 4.0000 mg | Freq: Four times a day (QID) | INTRAMUSCULAR | Status: DC | PRN

## 2024-08-12 MED ORDER — LACTATED RINGERS IV SOLN: INTRAVENOUS | Status: DC | PRN

## 2024-08-12 MED ORDER — SUGAMMADEX SODIUM 200 MG/2ML IV SOLN
INTRAVENOUS | Status: DC | PRN
  Administered 2024-08-12: 200 mg via INTRAVENOUS

## 2024-08-12 MED ORDER — FENTANYL CITRATE (PF) 250 MCG/5ML IJ SOLN
INTRAMUSCULAR | Status: DC | PRN
  Administered 2024-08-12: 50 ug via INTRAVENOUS
  Administered 2024-08-12: 100 ug via INTRAVENOUS
  Administered 2024-08-12: 50 ug via INTRAVENOUS

## 2024-08-12 MED ORDER — LIDOCAINE 2% (20 MG/ML) 5 ML SYRINGE
INTRAMUSCULAR | Status: AC
  Filled 2024-08-12: qty 5

## 2024-08-12 MED ORDER — BUPIVACAINE HCL (PF) 0.5 % IJ SOLN
INTRAMUSCULAR | Status: DC | PRN
  Administered 2024-08-12: 30 mL

## 2024-08-12 MED ORDER — FENTANYL CITRATE (PF) 250 MCG/5ML IJ SOLN
INTRAMUSCULAR | Status: AC
  Filled 2024-08-12: qty 5

## 2024-08-12 MED ORDER — FENTANYL CITRATE (PF) 100 MCG/2ML IJ SOLN: 25.0000 ug | INTRAMUSCULAR | Status: DC | PRN

## 2024-08-12 MED ORDER — PHENYLEPHRINE 80 MCG/ML (10ML) SYRINGE FOR IV PUSH (FOR BLOOD PRESSURE SUPPORT)
PREFILLED_SYRINGE | INTRAVENOUS | Status: DC | PRN
  Administered 2024-08-12: 80 ug via INTRAVENOUS

## 2024-08-12 MED ORDER — SODIUM CHLORIDE 0.9 % IR SOLN
Status: DC | PRN
  Administered 2024-08-12: 1000 mL

## 2024-08-12 MED ORDER — HYDROMORPHONE HCL 1 MG/ML IJ SOLN
INTRAMUSCULAR | Status: AC
  Filled 2024-08-12: qty 0.5

## 2024-08-12 MED ORDER — MIDAZOLAM HCL 2 MG/2ML IJ SOLN
INTRAMUSCULAR | Status: AC
Start: 2024-08-12 — End: 2024-08-12
  Filled 2024-08-12: qty 2

## 2024-08-12 MED ORDER — DEXAMETHASONE SODIUM PHOSPHATE 10 MG/ML IJ SOLN
INTRAMUSCULAR | Status: DC | PRN
  Administered 2024-08-12: 10 mg via INTRAVENOUS

## 2024-08-12 MED ORDER — ONDANSETRON HCL 4 MG/2ML IJ SOLN
INTRAMUSCULAR | Status: DC | PRN
  Administered 2024-08-12: 4 mg via INTRAVENOUS

## 2024-08-12 MED ORDER — PROPOFOL 10 MG/ML IV BOLUS
INTRAVENOUS | Status: DC | PRN
  Administered 2024-08-12: 200 mg via INTRAVENOUS
  Administered 2024-08-12: 50 mg via INTRAVENOUS

## 2024-08-12 MED ORDER — HYDROMORPHONE HCL 1 MG/ML IJ SOLN
INTRAMUSCULAR | Status: DC | PRN
  Administered 2024-08-12 (×2): .5 mg via INTRAVENOUS

## 2024-08-12 MED ORDER — LIDOCAINE 2% (20 MG/ML) 5 ML SYRINGE
INTRAMUSCULAR | Status: DC | PRN
  Administered 2024-08-12: 60 mg via INTRAVENOUS

## 2024-08-12 MED ORDER — ACETAMINOPHEN 500 MG PO TABS
500.0000 mg | ORAL_TABLET | Freq: Four times a day (QID) | ORAL | 1 refills | Status: AC | PRN
  Filled 2024-08-12: qty 60, 15d supply, fill #0

## 2024-08-12 MED ORDER — ESMOLOL HCL 100 MG/10ML IV SOLN
INTRAVENOUS | Status: DC | PRN
Start: 2024-08-12 — End: 2024-08-12
  Administered 2024-08-12: 20 mg via INTRAVENOUS

## 2024-08-12 MED ORDER — ONDANSETRON 4 MG PO TBDP
4.0000 mg | ORAL_TABLET | ORAL | 0 refills | Status: AC | PRN
  Filled 2024-08-12: qty 20, 4d supply, fill #0

## 2024-08-12 MED ORDER — OXYCODONE HCL 5 MG PO TABS
5.0000 mg | ORAL_TABLET | Freq: Once | ORAL | Status: AC | PRN
  Administered 2024-08-12: 5 mg via ORAL

## 2024-08-12 MED ORDER — PROPOFOL 10 MG/ML IV BOLUS
INTRAVENOUS | Status: AC
  Filled 2024-08-12: qty 20

## 2024-08-12 MED ORDER — MIDAZOLAM HCL 2 MG/2ML IJ SOLN
INTRAMUSCULAR | Status: DC | PRN
  Administered 2024-08-12: 2 mg via INTRAVENOUS

## 2024-08-12 MED ORDER — KETOROLAC TROMETHAMINE 15 MG/ML IJ SOLN
INTRAMUSCULAR | Status: DC | PRN
Start: 2024-08-12 — End: 2024-08-12
  Administered 2024-08-12: 15 mg via INTRAVENOUS

## 2024-08-12 MED ORDER — BUPIVACAINE LIPOSOME 1.3 % IJ SUSP
INTRAMUSCULAR | Status: AC
  Filled 2024-08-12: qty 20

## 2024-08-12 SURGICAL SUPPLY — 42 items
APPLICATOR ARISTA FLEXITIP XL (MISCELLANEOUS) IMPLANT
CNTNR URN SCR LID CUP LEK RST (MISCELLANEOUS) ×2 IMPLANT
COVER MAYO STAND STRL (DRAPES) ×2 IMPLANT
DERMABOND ADVANCED .7 DNX12 (GAUZE/BANDAGES/DRESSINGS) ×2 IMPLANT
DRAPE SURG IRRIG POUCH 19X23 (DRAPES) ×2 IMPLANT
DURAPREP 26ML APPLICATOR (WOUND CARE) ×2 IMPLANT
ELECTRODE REM PT RTRN 9FT ADLT (ELECTROSURGICAL) ×2 IMPLANT
GAUZE 4X4 16PLY ~~LOC~~+RFID DBL (SPONGE) IMPLANT
GLOVE BIOGEL PI IND STRL 7.0 (GLOVE) ×8 IMPLANT
GLOVE ECLIPSE 7.0 STRL STRAW (GLOVE) ×2 IMPLANT
GOWN STRL REUS W/ TWL LRG LVL3 (GOWN DISPOSABLE) ×2 IMPLANT
GOWN STRL REUS W/ TWL XL LVL3 (GOWN DISPOSABLE) ×4 IMPLANT
GRASPER SUT TROCAR 14GX15 (MISCELLANEOUS) IMPLANT
HEMOSTAT ARISTA ABSORB 3G PWDR (HEMOSTASIS) IMPLANT
IRRIGATION SUCT STRKRFLW 2 WTP (MISCELLANEOUS) IMPLANT
KIT PINK PAD W/HEAD ARM REST (MISCELLANEOUS) ×2 IMPLANT
KIT TURNOVER KIT B (KITS) ×2 IMPLANT
LIGASURE BLUNT TIP 5 LONG 44CM (ELECTROSURGICAL) IMPLANT
MANIFOLD NEPTUNE II (INSTRUMENTS) IMPLANT
NDL INSUFFLATION 14GA 120MM (NEEDLE) IMPLANT
NDL SPNL 22GX3.5 QUINCKE BK (NEEDLE) ×2 IMPLANT
NEEDLE INSUFFLATION 14GA 120MM (NEEDLE) ×2 IMPLANT
NEEDLE SPNL 22GX3.5 QUINCKE BK (NEEDLE) ×2 IMPLANT
NS IRRIG 1000ML POUR BTL (IV SOLUTION) ×2 IMPLANT
PACK LAPAROSCOPY BASIN (CUSTOM PROCEDURE TRAY) ×2 IMPLANT
PAD OB MATERNITY 11 LF (PERSONAL CARE ITEMS) ×2 IMPLANT
POUCH ENDO CATCH II 15MM (MISCELLANEOUS) IMPLANT
POUCH LAPAROSCOPIC INSTRUMENT (MISCELLANEOUS) ×2 IMPLANT
SET TUBE SMOKE EVAC HIGH FLOW (TUBING) ×2 IMPLANT
SHEARS HARMONIC 36 ACE (MISCELLANEOUS) IMPLANT
SLEEVE SCD COMPRESS KNEE MED (STOCKING) ×2 IMPLANT
SLEEVE SURGEON STRL (DRAPES) IMPLANT
SLEEVE Z-THREAD 5X100MM (TROCAR) ×4 IMPLANT
SUT MNCRL AB 4-0 PS2 18 (SUTURE) IMPLANT
SUT VIC AB 2-0 SH 27X BRD (SUTURE) IMPLANT
SUT VIC AB 4-0 PS2 18 (SUTURE) ×2 IMPLANT
SUT VICRYL 0 UR6 27IN ABS (SUTURE) IMPLANT
SYSTEM BAG RETRIEVAL 10MM (BASKET) IMPLANT
SYSTEM CARTER THOMASON II (TROCAR) IMPLANT
TRAY FOLEY W/BAG SLVR 14FR LF (SET/KITS/TRAYS/PACK) ×2 IMPLANT
TROCAR Z-THREAD FIOS 5X100MM (TROCAR) ×2 IMPLANT
WARMER LAPAROSCOPE (MISCELLANEOUS) ×2 IMPLANT

## 2024-08-12 NOTE — Telephone Encounter (Signed)
 Call to Dr. Jeralyn (on call MD) to discuss US  results done in office today that showed probable left ovarian torsion. Patient in significant pain since last Thursday. Surgery recommended. Patient aware and NPO since yesterday. Instructions by Dr. Jeralyn to go to [facility] and OR notified. Friend Brittney brought patient to appointment and will drive her to [facility]

## 2024-08-12 NOTE — Anesthesia Procedure Notes (Signed)
 Procedure Name: Intubation Date/Time: 08/12/2024 12:04 PM  Performed by: Scherrie Mast, CRNAPre-anesthesia Checklist: Patient identified, Emergency Drugs available, Suction available and Patient being monitored Patient Re-evaluated:Patient Re-evaluated prior to induction Oxygen Delivery Method: Circle system utilized Preoxygenation: Pre-oxygenation with 100% oxygen Induction Type: IV induction Ventilation: Mask ventilation without difficulty Laryngoscope Size: Mac and 4 Grade View: Grade I Tube type: Oral Tube size: 7.0 mm Number of attempts: 1 Airway Equipment and Method: Stylet and Oral airway Placement Confirmation: ETT inserted through vocal cords under direct vision, positive ETCO2 and breath sounds checked- equal and bilateral Secured at: 21 cm Tube secured with: Tape Dental Injury: Teeth and Oropharynx as per pre-operative assessment

## 2024-08-12 NOTE — H&P (View-Only) (Signed)
   Acute Office Visit  Subjective:    Patient ID: KALY MCQUARY, female    DOB: 08-16-1996, 28 y.o.   MRN: [id number]   HPI 28 y.o. presents today for ER follow up. Presented to ER 8/28 for lower abdominal pain that came on suddenly while at work. CT scan showed 10 x 7.5 cm left ovarian complex cystic and solid lesion. US  confirmed 10 cm complex mass, no torsion. Was given morphine  for pain - took last dose 3 days ago. Taking Oxycodone  now for pain, last dose at 3 am today. Still having significant pain today. Intermittent nausea. Cyst removed from naval that was endometriosis ~6-7 years ago. Friend Brittney brought her to appointment.   Patient's last menstrual period was 07/17/2024 (exact date).    Review of Systems  Constitutional: Negative.   Gastrointestinal:  Positive for nausea. Negative for vomiting.  Genitourinary:  Positive for pelvic pain.       Objective:    Physical Exam Constitutional:      Appearance: Normal appearance.     BP 102/78 (BP Location: Left Arm, Patient Position: Sitting, Cuff Size: Normal)   Pulse 98   Temp 99.2 F (37.3 C) (Oral)   Wt 196 lb (88.9 kg)   LMP 07/17/2024 (Exact Date)   SpO2 99%   BMI 31.64 kg/m  Wt Readings from Last 3 Encounters:  08/12/24 196 lb (88.9 kg)  03/31/24 203 lb 4.2 oz (92.2 kg)  01/16/24 202 lb (91.6 kg)        Assessment & Plan:   Problem List Items Addressed This Visit   None Visit Diagnoses       Torsion of left ovary    -  Primary     Ovarian mass, left          Both transabdominal and transvaginal techniques were necessary to evaluate anatomy (comparison is made to ultrasound and CT scan from 08/06/2024):  Anteverted uterus, normal size and shape, no myometrial masses.  Thin, symmetrical endometrium.  No masses or thickening seen.  Right ovary within normal limits with positive perfusion.  Left ovary enlarged with a 93 x 73 mm complex hemorrhagic cyst.  No definitive blood flow seen to the  surrounding ovarian tissue. No adnexal masses, no free fluid.  Plan: Ultrasound findings of probable ovarian torsion reviewed with patient. On Call MD Dr. Nowell notified and US  findings discussed. Surgery recommended. Friend will drive patient to [facility]U. All questions answered. Patient has been NPO since yesterday.     [name] DNP, 10:43 AM 08/12/2024

## 2024-08-12 NOTE — Brief Op Note (Signed)
 08/12/2024  2:27 PM  PATIENT:  Faith Bush Cart  28 y.o. female  PRE-OPERATIVE DIAGNOSIS:  OVARION TORSION  POST-OPERATIVE DIAGNOSIS:  OVARION TORSION  PROCEDURE:  Procedure(s) with comments: LAPAROSCOPY, DIAGNOSTIC (N/A) - LYSIS OF ADHESIONS SALPINGO-OOPHORECTOMY, LAPAROSCOPIC (Left)  SURGEON:  Surgeons and Role:    DEWAINE Jeralyn Crutch, MD - Primary  PHYSICIAN ASSISTANT: Jorene Moats, PA  ASSISTANTS: none   ANESTHESIA:   local and general  EBL:  25 ml   BLOOD ADMINISTERED:none  DRAINS: none   LOCAL MEDICATIONS USED:  BUPIVICAINE   SPECIMEN:  Source of Specimen:  left ovary and tube with cyst, intraabdominal adhesion  DISPOSITION OF SPECIMEN:  PATHOLOGY  COUNTS:  YES  TOURNIQUET:  * No tourniquets in log *  DICTATION: .Note written in EPIC  PLAN OF CARE: Discharge to home after PACU  PATIENT DISPOSITION:  PACU - hemodynamically stable.   Delay start of Pharmacological VTE agent (>24hrs) due to surgical blood loss or risk of bleeding: not applicable

## 2024-08-12 NOTE — Discharge Instructions (Signed)
Post-surgical Instructions, Outpatient Surgery  You may expect to feel dizzy, weak, and drowsy for as long as 24 hours after receiving the medicine that made you sleep (anesthetic). For the first 24 hours after your surgery:   Do not drive a car, ride a bicycle, participate in physical activities, or take public transportation until you are done taking narcotic pain medicines or as directed by Dr. Briscoe Deutscher.  Do not drink alcohol or take tranquilizers.  Do not take medicine that has not been prescribed by your physicians.  Do not sign important papers or make important decisions while on narcotic pain medicines.  Have a responsible person with you.   CARE OF INCISION If you have a bandage, you may remove it in one day.  If there are steri-strips or dermabond, just let this loosen on its own.  You may shower on the first day after your surgery.  Do not sit in a tub bath for one week. Avoid heavy lifting (more than 10 pounds/4.5 kilograms), pushing, or pulling.  Avoid activities that may risk injury to your incisions.   PAIN MANAGEMENT Ibuprofen 800mg .  (This is the same as 4-200mg  over the counter tablets of Motrin or ibuprofen.)  Take this every 6 hours or as needed for cramping.   Acetaminophen 1000mg  (This is the same as 2-500mg  over the counter extra strength tylenol). Take this every 6 hours for the first 3 days or as needed afterwards for pain Oxycodone 5mg  For more severe pain, take one or two tablets every four to six hours as needed for pain control.  (Remember that narcotic pain medications increase your risk of constipation.  If this becomes a problem, you may take an over the counter laxative like miralax.)  DO'S AND DON'T'S Do not take a tub bath for 2 weeks.  You may shower on the first day after your surgery Do not do any heavy lifting for one to two weeks.  This increases the chance of bleeding. Do move around as you feel able.  Stairs are fine.  You may begin to exercise again as  you feel able.  Do not lift any weights for two weeks. Do not put anything in the vagina for two weeks--no tampons, intercourse, or douching.    REGULAR MEDIATIONS/VITAMINS: You may restart all of your regular medications as prescribed. You may restart all of your vitamins as you normally take them.    PLEASE CALL OR SEEK MEDICAL CARE IF: You have persistent nausea and vomiting.  You have trouble eating or drinking.  You have an oral temperature above 100.5.  You have constipation that is not helped by adjusting diet or increasing fluid intake. Pain medicines are a common cause of constipation.  You have heavy vaginal bleeding You have redness or drainage from your incision(s) or there is increasing pain or tenderness near or in the surgical site.

## 2024-08-12 NOTE — Transfer of Care (Signed)
 Immediate Anesthesia Transfer of Care Note  Patient: Faith Bush  Procedure(s) Performed: LAPAROSCOPY, DIAGNOSTIC (Abdomen) SALPINGO-OOPHORECTOMY, LAPAROSCOPIC (Left: Uterus)  Patient Location: PACU  Anesthesia Type:General  Level of Consciousness: awake, alert , and oriented  Airway & Oxygen Therapy: Patient Spontanous Breathing  Post-op Assessment: Report given to RN  Post vital signs: Reviewed and stable  Last Vitals:  Vitals Value Taken Time  BP 127/113 08/12/24 14:46  Temp 98   Pulse 102 08/12/24 14:51  Resp 15 08/12/24 14:51  SpO2 92 % 08/12/24 14:51  Vitals shown include unfiled device data.  Last Pain:  Vitals:   08/12/24 1115  TempSrc: Oral  PainSc: 5          Complications: No notable events documented.

## 2024-08-12 NOTE — Op Note (Signed)
 Hale GORMAN Cart PROCEDURE DATE: 08/12/2024  PREOPERATIVE DIAGNOSIS: ovarian torsion, ovarian cyst POSTOPERATIVE DIAGNOSIS: ovarian torsion, ovarian cyst PROCEDURE: diagnostic laparoscopy, lysis of adhesions, left salpingo-oophorectomy SURGEON: Carter Quarry, MD ASSISTANT:  Jorene Moats, PA  An experienced assistant was required given the standard of surgical care given the complexity of the case.  This assistant was needed for exposure, dissection, suctioning, retraction, instrument exchange, and for overall help during the procedure.  INDICATIONS: 28 y.o. G0P0000 with left ovarian torsion.  Risks of surgery were discussed with the patient including but not limited to: bleeding which may require transfusion; infection which may require antibiotics; injury to surrounding organs; need for additional procedures including laparotomy;  and other postoperative/anesthesia complications. Written informed consent was obtained.    FINDINGS:  Normal external genitalia, normal appearing cervix  Laparoscopically: normal upper abdominal survey, small normal uterus, normal right fallopian tube and ovary, left ureter visualized, omental adhesion to enlarged ovary that was adhered to anterior cul de sac and sidewall in close approximation to sigmoid colon, enlarged edematous and necrotic left tube that was twisted ~ 4 times as well as utero-ovarian ligament, no apparent IP ligament visualized, serous content within the ovary; inflammatory adhesive tissue in the anterior cul de sac where the ovary had been attached  ANESTHESIA: General INTRAVENOUS FLUIDS:  1200 ml of LR ESTIMATED BLOOD LOSS:  25 ml URINE OUTPUT: 200 ml SPECIMENS:  left ovary with tube and cyst; adhesive tissue COMPLICATIONS:  None immediate.  PROCEDURE:  The patient was taken to the operating room where general anesthesia was obtained without difficulty.  She was then placed in the dorsal lithotomy position and prepared and draped in sterile  fashion.  After an adequate timeout was performed, a bivalved speculum was then placed in the patient's vagina, and the anterior lip of cervix grasped with the single-tooth tenaculum. The speculum was removed from the vagina.  Attention was then turned to the patient's abdomen where a 5-mm skin incision was made in the LUQ.  The 5-mm trocar and sleeve were then advanced without difficulty with the laparoscope under direct visualization into the abdomen.  The abdomen was then insufflated with carbon dioxide gas.  Adequate pneumoperitoneum was obtained.  A survey of the patient's pelvis and abdomen revealed the findings above.  A 5 mm incision was made in the umbilicus and an additional 5mm incision was made in the R mid quadrant the omental adhesion was taken down bluntly from the ovary. The ovary was then mobilized from the anterior abdominal wall and the anterior cul de sac. The ovary was then bluntly dissected and noted to be separate from the sigmoid. There was not identifable IP ligament but the ureter was seen vermiculating. The ovary was attempted to be detorsed but it was noted that the tube and utero-ovarian ligament were edematous and necrotic and decision made to proceed with salpingectomy. Initially the fallopian tube was separated from the cornua of the uterus and it was noted there was no IP ligament to the ovary and only the edematous, hemorrhagic utero-ovarian ligament and decision made to proceed with oophorectomy. Ligasure used to separate the utero-ovarian ligament and hemostasis was achieved. Operative site was investigated and ureter visualized. The contralateral ovary was normal appearing as well as it's tube. Adhesive, inflammatory tissue from the anterior cul de sac was removed and passe off for specimen. At this time, the umbilical trocar was removed and the incision extended sharply. A 15mm specimen retrieval bag was introduced and the specimen placed into  the bag and brought through the  incision. The specimen was removed in piecemeal form and the cyst ruptured. Once removed, the fascia of the umbilical incision was closed with 0 vicryl in a running fashion. The subcutaneous tissue closed with a single interrupted fashion with 2-0 vicryl.  Laparoscope was re-introduced into the abdomen and operative site was hemostatic. The abdomen was irrigated. The abdomen was desufflated and all instruments were then removed from the patient's abdomen.  All skin incisions were closed with 4-0 monocryl. They were also covered with dermabond.  The uterine manipulator was removed from the vagina without complications. The patient tolerated the procedure well.  Sponge, lap, and needle counts were correct times two.  The patient was then taken to the recovery room awake, extubated and in stable condition.   Carter Quarry, MD Minimally Invasive Gynecologic Surgery  Obstetrics and Gynecology, [facility] for [facility], [facility] 08/12/2024

## 2024-08-12 NOTE — Interval H&P Note (Addendum)
 History and Physical Interval Note:  08/12/2024 11:01 AM  Faith Bush  has presented today for surgery, with the diagnosis of OVARION TORSION.  The various methods of treatment have been discussed with the patient and family. After consideration of risks, benefits and other options for treatment, the patient has consented to  Procedure(s) with comments: LAPAROSCOPY, DIAGNOSTIC (N/A) - POSSIBLE OOPHORECTOMY as a surgical intervention.  The patient's history has been reviewed, patient examined, no change in status, stable for surgery.  I have reviewed the patient's chart and labs.  Questions were answered to the patient's satisfaction.    Continues to have abdominal soreness today. Prior surgical history of umbilical cyst removal (endometrioma) and tonsil surgery without complications. No other abdominal surgeries. Reports menses are manageable at baseline. Last ate at 6:30pm yesterday. Last drink of gatorade this morning. Aware of US  results demonstrating ovarian torsion.   NAD General abdominal tenderness  Patient desires surgical management with diagnostic laparoscopy with possible ovarian cystectomy, possible oophoerctomy.  The risks of surgery were discussed in detail with the patient including but not limited to: bleeding which may require transfusion or reoperation; infection which may require prolonged hospitalization or re-hospitalization and antibiotic therapy; injury to bowel, bladder, ureters and major vessels or other surrounding organs which may lead to other procedures; formation of adhesions; need for additional procedures including laparotomy or subsequent procedures secondary to intraoperative injury or abnormal pathology; thromboembolic phenomenon; incisional problems and other postoperative or anesthesia complications.  Patient was told that the likelihood that her condition and symptoms will be treated effectively with this surgical management was high; the postoperative  expectations were also discussed in detail. The patient also understands the alternative treatment options which were discussed in full. All questions were answered.       [name]

## 2024-08-12 NOTE — Anesthesia Preprocedure Evaluation (Signed)
 Anesthesia Evaluation  Patient identified by MRN, date of birth, ID band Patient awake    Reviewed: Allergy & Precautions, H&P , NPO status , Patient's Chart, lab work & pertinent test results  Airway Mallampati: II   Neck ROM: full    Dental   Pulmonary neg pulmonary ROS   breath sounds clear to auscultation       Cardiovascular negative cardio ROS  Rhythm:regular Rate:Normal     Neuro/Psych    GI/Hepatic   Endo/Other    Class 3 obesity  Renal/GU      Musculoskeletal   Abdominal   Peds  Hematology   Anesthesia Other Findings   Reproductive/Obstetrics                              Anesthesia Physical Anesthesia Plan  ASA: 2 and emergent  Anesthesia Plan: General   Post-op Pain Management:    Induction: Intravenous  PONV Risk Score and Plan: 3 and Ondansetron , Dexamethasone , Midazolam  and Treatment may vary due to age or medical condition  Airway Management Planned: Oral ETT  Additional Equipment:   Intra-op Plan:   Post-operative Plan: Extubation in OR  Informed Consent: I have reviewed the patients History and Physical, chart, labs and discussed the procedure including the risks, benefits and alternatives for the proposed anesthesia with the patient or authorized representative who has indicated his/her understanding and acceptance.     Dental advisory given  Plan Discussed with: CRNA, Anesthesiologist and Surgeon  Anesthesia Plan Comments:         Anesthesia Quick Evaluation

## 2024-08-12 NOTE — Progress Notes (Signed)
   Acute Office Visit  Subjective:    Patient ID: Faith Bush, female    DOB: 08-16-1996, 28 y.o.   MRN: [id number]   HPI 28 y.o. presents today for ER follow up. Presented to ER 8/28 for lower abdominal pain that came on suddenly while at work. CT scan showed 10 x 7.5 cm left ovarian complex cystic and solid lesion. US  confirmed 10 cm complex mass, no torsion. Was given morphine  for pain - took last dose 3 days ago. Taking Oxycodone  now for pain, last dose at 3 am today. Still having significant pain today. Intermittent nausea. Cyst removed from naval that was endometriosis ~6-7 years ago. Friend Brittney brought her to appointment.   Patient's last menstrual period was 07/17/2024 (exact date).    Review of Systems  Constitutional: Negative.   Gastrointestinal:  Positive for nausea. Negative for vomiting.  Genitourinary:  Positive for pelvic pain.       Objective:    Physical Exam Constitutional:      Appearance: Normal appearance.     BP 102/78 (BP Location: Left Arm, Patient Position: Sitting, Cuff Size: Normal)   Pulse 98   Temp 99.2 F (37.3 C) (Oral)   Wt 196 lb (88.9 kg)   LMP 07/17/2024 (Exact Date)   SpO2 99%   BMI 31.64 kg/m  Wt Readings from Last 3 Encounters:  08/12/24 196 lb (88.9 kg)  03/31/24 203 lb 4.2 oz (92.2 kg)  01/16/24 202 lb (91.6 kg)        Assessment & Plan:   Problem List Items Addressed This Visit   None Visit Diagnoses       Torsion of left ovary    -  Primary     Ovarian mass, left          Both transabdominal and transvaginal techniques were necessary to evaluate anatomy (comparison is made to ultrasound and CT scan from 08/06/2024):  Anteverted uterus, normal size and shape, no myometrial masses.  Thin, symmetrical endometrium.  No masses or thickening seen.  Right ovary within normal limits with positive perfusion.  Left ovary enlarged with a 93 x 73 mm complex hemorrhagic cyst.  No definitive blood flow seen to the  surrounding ovarian tissue. No adnexal masses, no free fluid.  Plan: Ultrasound findings of probable ovarian torsion reviewed with patient. On Call MD Dr. Nowell notified and US  findings discussed. Surgery recommended. Friend will drive patient to [facility]. All questions answered. Patient has been NPO since yesterday.     [name] DNP, 10:43 AM 08/12/2024

## 2024-08-13 ENCOUNTER — Encounter (HOSPITAL_COMMUNITY): Payer: Self-pay | Admitting: Obstetrics and Gynecology

## 2024-08-13 ENCOUNTER — Telehealth: Payer: Self-pay | Admitting: Nurse Practitioner

## 2024-08-13 NOTE — Telephone Encounter (Signed)
 Per review of EPIC, surgery completed on 08/12/24 by Dr. Jeralyn.   Reviewed with Dr. Jeralyn and Annabella, patient will f/u with Dr. Jeralyn for initial post-op appointment.

## 2024-08-13 NOTE — Telephone Encounter (Signed)
 Spoke with patient. At home resting, minimal pain. Aware to follow up with Dr. Jeralyn next week.

## 2024-08-14 ENCOUNTER — Ambulatory Visit: Payer: Self-pay | Admitting: Family

## 2024-08-14 LAB — SURGICAL PATHOLOGY

## 2024-08-19 ENCOUNTER — Encounter: Payer: Self-pay | Admitting: Obstetrics and Gynecology

## 2024-08-25 NOTE — Anesthesia Postprocedure Evaluation (Signed)
 Anesthesia Post Note  Patient: Faith Bush  Procedure(s) Performed: LAPAROSCOPY, DIAGNOSTIC (Abdomen) SALPINGO-OOPHORECTOMY, LAPAROSCOPIC (Left: Uterus)     Patient location during evaluation: PACU Anesthesia Type: General Level of consciousness: awake and alert Pain management: pain level controlled Vital Signs Assessment: post-procedure vital signs reviewed and stable Respiratory status: spontaneous breathing, nonlabored ventilation, respiratory function stable and patient connected to nasal cannula oxygen Cardiovascular status: blood pressure returned to baseline and stable Postop Assessment: no apparent nausea or vomiting Anesthetic complications: no   There were no known notable events for this encounter.  Last Vitals:  Vitals:   08/12/24 1515 08/12/24 1530  BP: 134/88 130/84  Pulse: 76 78  Resp: 12 18  Temp:  36.6 C  SpO2: 99% 100%    Last Pain:  Vitals:   08/12/24 1550  TempSrc:   PainSc: 2                  Ayansh Feutz S

## 2024-08-26 ENCOUNTER — Ambulatory Visit (INDEPENDENT_AMBULATORY_CARE_PROVIDER_SITE_OTHER): Admitting: Obstetrics and Gynecology

## 2024-08-26 ENCOUNTER — Encounter: Payer: Self-pay | Admitting: Obstetrics and Gynecology

## 2024-08-26 VITALS — BP 126/77 | HR 71 | Wt 206.3 lb

## 2024-08-26 DIAGNOSIS — N83519 Torsion of ovary and ovarian pedicle, unspecified side: Secondary | ICD-10-CM

## 2024-08-26 DIAGNOSIS — N8353 Torsion of ovary, ovarian pedicle and fallopian tube: Secondary | ICD-10-CM | POA: Diagnosis not present

## 2024-08-26 DIAGNOSIS — Z09 Encounter for follow-up examination after completed treatment for conditions other than malignant neoplasm: Secondary | ICD-10-CM | POA: Diagnosis not present

## 2024-08-26 NOTE — Progress Notes (Signed)
 New Patient is in the office for post op visit Left salpingo-oophorectomy was performed on 08/12/24 Last pap 01/16/24 Pt reports no pain today, previously day reports discomfort at navel area. LMP 08/16/24 Declines BC today

## 2024-08-26 NOTE — Progress Notes (Signed)
   POSTOPERATIVE VISIT NOTE   Subjective:     Faith Bush is a 28 y.o. G0P0000 who presents to the clinic 2 weeks status post left oophorectomy for adnexal mass and ovarian torsion. Eating a regular diet without difficulty. Bowel movements are normal. Pain is controlled without any medications. Incision: doing well Pain much improved since surgery Has questions regarding surgery and implications moving forwar  The following portions of the patient's history were reviewed and updated as appropriate: allergies, current medications, past family history, past medical history, past social history, past surgical history, and problem list..   Review of Systems Pertinent items are noted in HPI.    Objective:    BP 126/77   Pulse 71   Wt 206 lb 4.8 oz (93.6 kg)   LMP 08/16/2024 (Exact Date) Comment: UPT nneg on 08/12/2024  BMI 33.30 kg/m  General:  alert, cooperative, and no distress  Abdomen: soft, bowel sounds active, non-tender  Incision:   healing well, no drainage, no erythema, no hernia, no seroma, no swelling, no dehiscence, incision well approximated  Pelvic:   Exam deferred.    Pathology Results: FINAL MICROSCOPIC DIAGNOSIS:   A. INTRAABDOMINAL ADHESION:  - Fragments of congested and inflamed fibrous tissue with  fibrinopurulent material.   B. FALLOPIAN TUBE AND OVARY AND CYST, LEFT, SALPINGO-OOPHORECTOMY:  - Ovarian and fallopian tissue with congestion, hemorrhage, and marked  ischemic change compatible with clinical impression of torsion.    Assessment:   Doing well postoperatively. Operative findings again reviewed. Pathology report discussed.   Plan:    1. Postop check (Primary) Doing well   2. Ovarian torsion Resolved s/p left SO Reviewed intraoperative images    Activity restrictions: none Anticipated return to work: now. Follow up: return to primary GYN  Carter Quarry, MD Obstetrician & Gynecologist, [facility] for [employer], [facility]

## 2024-11-02 ENCOUNTER — Ambulatory Visit (INDEPENDENT_AMBULATORY_CARE_PROVIDER_SITE_OTHER): Payer: BC Managed Care – PPO | Admitting: Family

## 2024-11-02 ENCOUNTER — Encounter: Payer: Self-pay | Admitting: Family

## 2024-11-02 VITALS — BP 136/87 | HR 64 | Temp 98.8°F | Resp 16 | Ht 66.0 in | Wt 206.4 lb

## 2024-11-02 DIAGNOSIS — Z13228 Encounter for screening for other metabolic disorders: Secondary | ICD-10-CM | POA: Diagnosis not present

## 2024-11-02 DIAGNOSIS — Z0000 Encounter for general adult medical examination without abnormal findings: Secondary | ICD-10-CM

## 2024-11-02 DIAGNOSIS — Z131 Encounter for screening for diabetes mellitus: Secondary | ICD-10-CM

## 2024-11-02 DIAGNOSIS — Z130 Encounter for screening for diseases of the blood and blood-forming organs and certain disorders involving the immune mechanism: Secondary | ICD-10-CM | POA: Diagnosis not present

## 2024-11-02 DIAGNOSIS — Z1329 Encounter for screening for other suspected endocrine disorder: Secondary | ICD-10-CM

## 2024-11-02 DIAGNOSIS — Z13220 Encounter for screening for lipoid disorders: Secondary | ICD-10-CM

## 2024-11-02 NOTE — Progress Notes (Signed)
 Patient ID: Faith Bush, female    DOB: 06-May-1996  MRN: [id number]  CC: Annual Exam  Subjective: Faith Bush is a 28 y.o. female who presents for annual exam.   Her concerns today include:  - Established with Gynecology for women's health maintenance/screenings.   Patient Active Problem List   Diagnosis Date Noted   Tonsillar hypertrophy 03/31/2024   Chronic tonsillitis 03/31/2024   Acute pharyngitis 12/09/2023   Constipation 09/28/2019   Healthcare maintenance 05/27/2018   Eczema 08/12/2017     Current Outpatient Medications on File Prior to Visit  Medication Sig Dispense Refill   ibuprofen  (ADVIL ) 600 MG tablet Take 1 tablet (600 mg total) by mouth every 6 (six) hours. Please take every 6 hrs, and stagger this medication 3 hrs apart from Tylenol  60 tablet 1   acetaminophen  (TYLENOL ) 500 MG tablet Take 1 tablet (500 mg total) by mouth every 6 (six) hours as needed. Please take every 6 hrs and stagger with Motrin  3 hrs apart from each other. Please do not exceed maximum daily dose to avoid liver damage (Patient not taking: Reported on 08/26/2024) 60 tablet 1   ondansetron  (ZOFRAN -ODT) 4 MG disintegrating tablet Dissolve 1 tablet (4 mg total) under the tongue every 4 (four) hours as needed. (Patient not taking: Reported on 08/26/2024) 20 tablet 0   oxyCODONE  (ROXICODONE ) 5 MG immediate release tablet Take 1 tablet (5 mg total) by mouth every 6 (six) hours as needed for severe pain (pain score 7-10) (for pain after tonsillectomy not controlled with Tylenol  and Motrin ). (Patient not taking: Reported on 08/26/2024) 15 tablet 0   No current facility-administered medications on file prior to visit.    No Known Allergies  Social History   Socioeconomic History   Marital status: Single    Spouse name: Not on file   Number of children: Not on file   Years of education: Not on file   Highest education level: Master's degree (e.g., MA, MS, MEng, MEd, MSW, MBA)  Occupational  History   Not on file  Tobacco Use   Smoking status: Never   Smokeless tobacco: Never  Vaping Use   Vaping status: Never Used  Substance and Sexual Activity   Alcohol use: Yes    Comment: weekly   Drug use: Yes    Types: Marijuana   Sexual activity: Yes    Birth control/protection: None  Other Topics Concern   Not on file  Social History Narrative   Not on file   Social Drivers of Health   Financial Resource Strain: Low Risk  (11/02/2024)   Overall Financial Resource Strain (CARDIA)    Difficulty of Paying Living Expenses: Not hard at all  Food Insecurity: No Food Insecurity (11/02/2024)   Hunger Vital Sign    Worried About Running Out of Food in the Last Year: Never true    Ran Out of Food in the Last Year: Never true  Transportation Needs: No Transportation Needs (11/02/2024)   PRAPARE - Administrator, Civil Service (Medical): No    Lack of Transportation (Non-Medical): No  Physical Activity: Insufficiently Active (11/02/2024)   Exercise Vital Sign    Days of Exercise per Week: 2 days    Minutes of Exercise per Session: 60 min  Stress: No Stress Concern Present (11/02/2024)   [employer] of Occupational Health - Occupational Stress Questionnaire    Feeling of Stress: Not at all  Social Connections: Socially Isolated (11/02/2024)   Social Connection and  Isolation Panel    Frequency of Communication with Friends and Family: Twice a week    Frequency of Social Gatherings with Friends and Family: Once a week    Attends Religious Services: Never    Database Administrator or Organizations: No    Attends Banker Meetings: Never    Marital Status: Never married  Intimate Partner Violence: Not At Risk (11/02/2024)   Humiliation, Afraid, Rape, and Kick questionnaire    Fear of Current or Ex-Partner: No    Emotionally Abused: No    Physically Abused: No    Sexually Abused: No    Family History  Problem Relation Age of Onset   Hypertension  Father    Hypertension Maternal Grandmother    Lung cancer Maternal Grandfather    Diabetes Paternal Grandmother     Past Surgical History:  Procedure Laterality Date   EXCISION OF KELOID N/A 07/28/2018   Procedure: EXCISION OF UMBILICAL MASS / CYST;  Surgeon: Rubin Calamity, MD;  Location: [facility] OR;  Service: General;  Laterality: N/A;   LAPAROSCOPIC SALPINGO OOPHERECTOMY Left 08/12/2024   Procedure: SALPINGO-OOPHORECTOMY, LAPAROSCOPIC;  Surgeon: Jeralyn Crutch, MD;  Location: [facility] OR;  Service: Gynecology;  Laterality: Left;   LAPAROSCOPY N/A 08/12/2024   Procedure: LAPAROSCOPY, DIAGNOSTIC;  Surgeon: Jeralyn Crutch, MD;  Location: [facility] OR;  Service: Gynecology;  Laterality: N/A;  LYSIS OF ADHESIONS   TONSILLECTOMY Bilateral 03/31/2024   Procedure: TONSILLECTOMY;  Surgeon: Okey Burns, MD;  Location: [facility];  Service: ENT;  Laterality: Bilateral;   WISDOM TOOTH EXTRACTION      ROS: Review of Systems Negative except as stated above  PHYSICAL EXAM: BP 136/87   Pulse 64   Temp 98.8 F (37.1 C) (Oral)   Resp 16   Ht 5' 6 (1.676 m)   Wt 206 lb 6.4 oz (93.6 kg)   LMP 10/06/2024 (Exact Date)   SpO2 98%   BMI 33.31 kg/m   Physical Exam HENT:     Head: Normocephalic and atraumatic.     Right Ear: Tympanic membrane, ear canal and external ear normal.     Left Ear: Tympanic membrane, ear canal and external ear normal.     Nose: Nose normal.     Mouth/Throat:     Mouth: Mucous membranes are moist.     Pharynx: Oropharynx is clear.  Eyes:     Extraocular Movements: Extraocular movements intact.     Conjunctiva/sclera: Conjunctivae normal.     Pupils: Pupils are equal, round, and reactive to light.  Neck:     Thyroid : No thyroid  mass, thyromegaly or thyroid  tenderness.  Cardiovascular:     Rate and Rhythm: Normal rate and regular rhythm.     Pulses: Normal pulses.     Heart sounds: Normal heart sounds.  Pulmonary:     Effort: Pulmonary effort is  normal.     Breath sounds: Normal breath sounds.  Chest:     Comments: Patient declined. Abdominal:     General: Bowel sounds are normal.     Palpations: Abdomen is soft.  Genitourinary:    Comments: Patient declined. Musculoskeletal:        General: Normal range of motion.     Right shoulder: Normal.     Left shoulder: Normal.     Right upper arm: Normal.     Left upper arm: Normal.     Right elbow: Normal.     Left elbow: Normal.     Right forearm: Normal.  Left forearm: Normal.     Right wrist: Normal.     Left wrist: Normal.     Right hand: Normal.     Left hand: Normal.     Cervical back: Normal, normal range of motion and neck supple.     Thoracic back: Normal.     Lumbar back: Normal.     Right hip: Normal.     Left hip: Normal.     Right upper leg: Normal.     Left upper leg: Normal.     Right knee: Normal.     Left knee: Normal.     Right lower leg: Normal.     Left lower leg: Normal.     Right ankle: Normal.     Left ankle: Normal.     Right foot: Normal.     Left foot: Normal.  Skin:    General: Skin is warm and dry.     Capillary Refill: Capillary refill takes less than 2 seconds.  Neurological:     General: No focal deficit present.     Mental Status: She is alert and oriented to person, place, and time.  Psychiatric:        Mood and Affect: Mood normal.        Behavior: Behavior normal.    ASSESSMENT AND PLAN: 1. Annual physical exam (Primary) - Counseled on 150 minutes of exercise per week as tolerated, healthy eating (including decreased daily intake of saturated fats, cholesterol, added sugars, sodium), STI prevention, and routine healthcare maintenance.  2. Screening for metabolic disorder - Routine screening.  - CMP14+EGFR  3. Screening for deficiency anemia - Routine screening.  - CBC  4. Diabetes mellitus screening - Routine screening.  - Hemoglobin A1c  5. Screening cholesterol level - Routine screening.  - Lipid panel  6.  Thyroid  disorder screen - Routine screening.  - TSH   Patient was given the opportunity to ask questions.  Patient verbalized understanding of the plan and was able to repeat key elements of the plan. Patient was given clear instructions to go to Emergency Department or return to medical center if symptoms don't improve, worsen, or new problems develop.The patient verbalized understanding.   Orders Placed This Encounter  Procedures   CBC   Lipid panel   CMP14+EGFR   Hemoglobin A1c   TSH    Return in about 1 year (around 11/02/2025) for Physical per patient preference.  Greig JINNY Chute, NP

## 2024-11-03 ENCOUNTER — Ambulatory Visit: Payer: Self-pay | Admitting: Family

## 2024-11-03 DIAGNOSIS — Z13228 Encounter for screening for other metabolic disorders: Secondary | ICD-10-CM

## 2024-11-03 LAB — CMP14+EGFR
ALT: 15 IU/L (ref 0–32)
AST: 42 IU/L — ABNORMAL HIGH (ref 0–40)
Albumin: 4.5 g/dL (ref 4.0–5.0)
Alkaline Phosphatase: 71 IU/L (ref 41–116)
BUN/Creatinine Ratio: 12 (ref 9–23)
BUN: 8 mg/dL (ref 6–20)
Bilirubin Total: 0.4 mg/dL (ref 0.0–1.2)
CO2: 21 mmol/L (ref 20–29)
Calcium: 9.5 mg/dL (ref 8.7–10.2)
Chloride: 105 mmol/L (ref 96–106)
Creatinine, Ser: 0.69 mg/dL (ref 0.57–1.00)
Globulin, Total: 2.4 g/dL (ref 1.5–4.5)
Glucose: 83 mg/dL (ref 70–99)
Potassium: 4 mmol/L (ref 3.5–5.2)
Sodium: 141 mmol/L (ref 134–144)
Total Protein: 6.9 g/dL (ref 6.0–8.5)
eGFR: 120 mL/min/1.73

## 2024-11-03 LAB — CBC
Hematocrit: 41.4 % (ref 34.0–46.6)
Hemoglobin: 13.3 g/dL (ref 11.1–15.9)
MCH: 28.9 pg (ref 26.6–33.0)
MCHC: 32.1 g/dL (ref 31.5–35.7)
MCV: 90 fL (ref 79–97)
Platelets: 280 x10E3/uL (ref 150–450)
RBC: 4.61 x10E6/uL (ref 3.77–5.28)
RDW: 13.1 % (ref 11.7–15.4)
WBC: 6 x10E3/uL (ref 3.4–10.8)

## 2024-11-03 LAB — LIPID PANEL
Chol/HDL Ratio: 1.9 ratio (ref 0.0–4.4)
Cholesterol, Total: 179 mg/dL (ref 100–199)
HDL: 93 mg/dL
LDL Chol Calc (NIH): 78 mg/dL (ref 0–99)
Triglycerides: 35 mg/dL (ref 0–149)
VLDL Cholesterol Cal: 8 mg/dL (ref 5–40)

## 2024-11-03 LAB — HEMOGLOBIN A1C
Est. average glucose Bld gHb Est-mCnc: 100 mg/dL
Hgb A1c MFr Bld: 5.1 % (ref 4.8–5.6)

## 2024-11-03 LAB — TSH: TSH: 2.24 u[IU]/mL (ref 0.450–4.500)

## 2025-01-20 ENCOUNTER — Ambulatory Visit: Payer: BC Managed Care – PPO | Admitting: Nurse Practitioner

## 2025-11-02 ENCOUNTER — Encounter: Admitting: Family
